# Patient Record
Sex: Female | Born: 1980 | Race: White | Hispanic: No | Marital: Married | Smoking: Former smoker
Health system: Southern US, Community
[De-identification: ages and names within clinical notes are randomized; demographics above are authoritative.]

## PROBLEM LIST (undated history)

## (undated) DIAGNOSIS — K602 Anal fissure, unspecified: Secondary | ICD-10-CM

## (undated) DIAGNOSIS — N390 Urinary tract infection, site not specified: Secondary | ICD-10-CM

## (undated) DIAGNOSIS — IMO0002 Reserved for concepts with insufficient information to code with codable children: Secondary | ICD-10-CM

## (undated) DIAGNOSIS — O009 Unspecified ectopic pregnancy without intrauterine pregnancy: Secondary | ICD-10-CM

## (undated) DIAGNOSIS — O24419 Gestational diabetes mellitus in pregnancy, unspecified control: Secondary | ICD-10-CM

## (undated) HISTORY — DX: Anal fissure, unspecified: K60.2

## (undated) HISTORY — DX: Unspecified ectopic pregnancy without intrauterine pregnancy: O00.90

## (undated) HISTORY — PX: INDUCED ABORTION: SHX677

## (undated) HISTORY — DX: Gestational diabetes mellitus in pregnancy, unspecified control: O24.419

## (undated) HISTORY — DX: Reserved for concepts with insufficient information to code with codable children: IMO0002

---

## 1999-05-18 ENCOUNTER — Emergency Department (HOSPITAL_COMMUNITY): Admission: EM | Admit: 1999-05-18 | Discharge: 1999-05-18 | Payer: Self-pay | Admitting: Emergency Medicine

## 1999-11-15 ENCOUNTER — Other Ambulatory Visit: Admission: RE | Admit: 1999-11-15 | Discharge: 1999-11-15 | Payer: Self-pay | Admitting: Obstetrics and Gynecology

## 2000-11-02 ENCOUNTER — Other Ambulatory Visit: Admission: RE | Admit: 2000-11-02 | Discharge: 2000-11-02 | Payer: Self-pay | Admitting: Obstetrics and Gynecology

## 2001-06-23 ENCOUNTER — Emergency Department (HOSPITAL_COMMUNITY): Admission: EM | Admit: 2001-06-23 | Discharge: 2001-06-23 | Payer: Self-pay | Admitting: Emergency Medicine

## 2001-06-23 ENCOUNTER — Encounter: Payer: Self-pay | Admitting: Emergency Medicine

## 2004-11-17 DIAGNOSIS — IMO0002 Reserved for concepts with insufficient information to code with codable children: Secondary | ICD-10-CM

## 2004-11-17 DIAGNOSIS — R87619 Unspecified abnormal cytological findings in specimens from cervix uteri: Secondary | ICD-10-CM

## 2004-11-17 HISTORY — DX: Reserved for concepts with insufficient information to code with codable children: IMO0002

## 2004-11-17 HISTORY — DX: Unspecified abnormal cytological findings in specimens from cervix uteri: R87.619

## 2008-04-25 ENCOUNTER — Ambulatory Visit: Payer: Self-pay | Admitting: Family Medicine

## 2008-04-25 DIAGNOSIS — J019 Acute sinusitis, unspecified: Secondary | ICD-10-CM

## 2008-04-25 DIAGNOSIS — J309 Allergic rhinitis, unspecified: Secondary | ICD-10-CM | POA: Insufficient documentation

## 2008-11-17 DIAGNOSIS — O24419 Gestational diabetes mellitus in pregnancy, unspecified control: Secondary | ICD-10-CM

## 2008-11-17 DIAGNOSIS — K602 Anal fissure, unspecified: Secondary | ICD-10-CM

## 2008-11-17 HISTORY — DX: Anal fissure, unspecified: K60.2

## 2008-11-17 HISTORY — DX: Gestational diabetes mellitus in pregnancy, unspecified control: O24.419

## 2009-02-06 ENCOUNTER — Ambulatory Visit: Admission: RE | Admit: 2009-02-06 | Discharge: 2009-02-06 | Payer: Self-pay | Admitting: Obstetrics and Gynecology

## 2009-07-24 ENCOUNTER — Encounter: Admission: RE | Admit: 2009-07-24 | Discharge: 2009-07-24 | Payer: Self-pay | Admitting: Obstetrics and Gynecology

## 2009-08-31 ENCOUNTER — Inpatient Hospital Stay (HOSPITAL_COMMUNITY): Admission: AD | Admit: 2009-08-31 | Discharge: 2009-09-01 | Payer: Self-pay | Admitting: Obstetrics

## 2009-09-11 ENCOUNTER — Encounter: Admission: RE | Admit: 2009-09-11 | Discharge: 2009-09-11 | Payer: Self-pay | Admitting: Obstetrics and Gynecology

## 2009-10-09 ENCOUNTER — Inpatient Hospital Stay (HOSPITAL_COMMUNITY): Admission: AD | Admit: 2009-10-09 | Discharge: 2009-10-11 | Payer: Self-pay | Admitting: Obstetrics and Gynecology

## 2009-10-09 ENCOUNTER — Inpatient Hospital Stay (HOSPITAL_COMMUNITY): Admission: AD | Admit: 2009-10-09 | Discharge: 2009-10-09 | Payer: Self-pay | Admitting: Obstetrics and Gynecology

## 2009-10-15 ENCOUNTER — Ambulatory Visit: Admission: RE | Admit: 2009-10-15 | Discharge: 2009-10-15 | Payer: Self-pay | Admitting: Obstetrics and Gynecology

## 2009-10-19 ENCOUNTER — Ambulatory Visit: Admission: RE | Admit: 2009-10-19 | Discharge: 2009-10-19 | Payer: Self-pay | Admitting: Obstetrics and Gynecology

## 2010-01-07 ENCOUNTER — Ambulatory Visit: Payer: Self-pay | Admitting: Family Medicine

## 2010-01-07 DIAGNOSIS — K602 Anal fissure, unspecified: Secondary | ICD-10-CM

## 2010-02-13 ENCOUNTER — Ambulatory Visit: Payer: Self-pay | Admitting: Family Medicine

## 2010-02-13 DIAGNOSIS — R300 Dysuria: Secondary | ICD-10-CM | POA: Insufficient documentation

## 2010-02-13 LAB — CONVERTED CEMR LAB
Bilirubin Urine: NEGATIVE
Glucose, Urine, Semiquant: NEGATIVE
Ketones, urine, test strip: NEGATIVE
Nitrite: NEGATIVE
Protein, U semiquant: NEGATIVE
Specific Gravity, Urine: 1.01
Urobilinogen, UA: 0.2
WBC Urine, dipstick: NEGATIVE
pH: 5.5

## 2010-02-14 ENCOUNTER — Encounter: Payer: Self-pay | Admitting: Family Medicine

## 2010-02-14 LAB — CONVERTED CEMR LAB
Clue Cells Wet Prep HPF POC: NONE SEEN
Trich, Wet Prep: NONE SEEN
Yeast Wet Prep HPF POC: NONE SEEN

## 2010-08-02 ENCOUNTER — Encounter: Payer: Self-pay | Admitting: Family Medicine

## 2010-11-14 ENCOUNTER — Ambulatory Visit
Admission: RE | Admit: 2010-11-14 | Discharge: 2010-11-14 | Payer: Self-pay | Source: Home / Self Care | Attending: Family Medicine | Admitting: Family Medicine

## 2010-11-14 DIAGNOSIS — J0100 Acute maxillary sinusitis, unspecified: Secondary | ICD-10-CM

## 2010-12-17 NOTE — Assessment & Plan Note (Signed)
Summary: dysuria   Vital Signs:  Patient profile:   30 year old female Height:      68 inches Weight:      148 pounds BMI:     22.58 O2 Sat:      98 % on Room air Temp:     98.4 degrees F oral Pulse rate:   89 / minute BP sitting:   112 / 72  (left arm) Cuff size:   regular  Vitals Entered By: Payton Spark CMA (February 13, 2010 11:08 AM)  O2 Flow:  Room air CC: C/O burning w/ urination, frequency and urgency x 3 days. Denies vaginal discharge, fever, and back pain. 4 months postpartum   Primary Care Provider:  Seymour Bars DO  CC:  C/O burning w/ urination, frequency and urgency x 3 days. Denies vaginal discharge, fever, and and back pain. 4 months postpartum.  History of Present Illness: 30 yo WF presents for dysuria, urgency and frequency that started 4 days ago.  She is having to void mutliple times at night to feel like she is completely emptying.  Her last UTI was last year.  She does not drink soda.  Denies vaginal discharge.  She has not had a period since having her baby 4 mos ago.  She is nursing.    She is not using anything for birth control.      Current Medications (verified): 1)  None  Allergies (verified): No Known Drug Allergies  Past History:  Past Medical History: Reviewed history from 01/07/2010 and no changes required. G1P1  Social History: Reviewed history from 01/07/2010 and no changes required. 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  3 mos old daughter Fransisco Hertz smoking in 2009 Denies ETOH. No regular exercise. Currently breastfeeding.  Review of Systems      See HPI  Physical Exam  General:  alert, well-developed, well-nourished, and well-hydrated.   Head:  normocephalic and atraumatic.   Eyes:  sclera non icteric Nose:  no nasal discharge.   Mouth:  pharynx pink and moist.   Neck:  no masses.   Lungs:  Normal respiratory effort, chest expands symmetrically. Lungs are clear to auscultation, no crackles  or wheezes. Heart:  Normal rate and regular rhythm. S1 and S2 normal without gallop, murmur, click, rub or other extra sounds. Abdomen:  soft.  no CVAT or suprapubic TTP.   Extremities:  no LE edema Skin:  color normal.     Impression & Recommendations:  Problem # 1:  DYSURIA (ICD-788.1) UA + for blood with ? menstrual bleeding.  Send urine for cx and wet prep obtained.  F/U results in the next 48 hrs and treat accordingly. Can use OTC AZO x 48 hrs and topical vagistat cream for comfort.  If both are normal, this could be chemical urethritis. Orders: UA Dipstick w/o Micro (automated)  (81003) T-Culture, Urine ([id number]) T-Wet Prep for Christoper Allegra, [facility] [phone])  Patient Instructions: 1)  Drink plenty of water. 2)  Use otc AZO for the next 2 days. 3)  Use OTC Vagistat cream for external comfort. 4)  Will call you with wet prep results tomorrow and urine culture results on Friday. 5)  Treat as indicated.  Laboratory Results   Urine Tests    Routine Urinalysis   Color: yellow Appearance: Clear Glucose: negative   (Normal Range: Negative) Bilirubin: negative   (Normal Range: Negative) Ketone: negative   (Normal Range: Negative) Spec. Gravity: 1.010   (Normal Range:  1.003-1.035) Blood: moderate   (Normal Range: Negative) pH: 5.5   (Normal Range: 5.0-8.0) Protein: negative   (Normal Range: Negative) Urobilinogen: 0.2   (Normal Range: 0-1) Nitrite: negative   (Normal Range: Negative) Leukocyte Esterace: negative   (Normal Range: Negative)

## 2010-12-17 NOTE — Letter (Signed)
Summary: Letter with Sigmoidoscopy Results/[facility]  Letter with Sigmoidoscopy Results/[facility]   Imported By: Lanelle Bal 08/13/2010 11:02:57  _____________________________________________________________________  External Attachment:    Type:   Image     Comment:   External Document

## 2010-12-17 NOTE — Assessment & Plan Note (Signed)
Summary: NOV anal fissure   Vital Signs:  Patient profile:   30 year old female Height:      68 inches Weight:      154 pounds BMI:     23.50 O2 Sat:      100 % on Room air Temp:     98.8 degrees F oral Pulse rate:   98 / minute BP sitting:   111 / 73  (left arm) Cuff size:   regular  Vitals Entered By: Payton Spark CMA (January 07, 2010 2:28 PM)  O2 Flow:  Room air CC: New to est. Noticing blood in toilet after bm's. Also c/o congestion. Is taking cephalexina and ketoconazole for thrush.    Primary Care Provider:  Seymour Bars DO  CC:  New to est. Noticing blood in toilet after bm's. Also c/o congestion. Is taking cephalexina and ketoconazole for thrush. .  History of Present Illness: 30 yo WF presents for blood in the stool that started a wk ago.  She has bright red blood on the toilet paper and in the toilet.  Denies hemorrhoids even though she is 3 mos post partum.  She has sharp pain with defecation.  She is not using anything.  No fam hx of colon cancer.  She has not had a period since her baby was born.  She had a level 2 tear with delivery but did not have any problems after.  She is on abx for mastitis per her OB.    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  3 mos old daughter Fransisco Hertz smoking in 2009 Denies ETOH. No regular exercise. Currently breastfeeding.  Review of Systems       no fevers/sweats/weakness, unexplained wt loss/gain, no change in vision, no difficulty hearing, ringing in ears, no hay fever/allergies, no CP/discomfort, no palpitations, no breast lump/nipple discharge, no cough/wheeze, no blood in stool, no N/V/D, no nocturia, no leaking urine, no unusual vag bleeding, no  vaginal/penile discharge, no muscle/joint pain, no rash, no new/changing mole, no HA, no memory loss, +anxiety, no sleep problem, no depression, no unexplained lumps, no easy bruising/bleeding, no concern with sexual function   Physical Exam  General:  alert, well-developed, well-nourished, and well-hydrated.   Head:  normocephalic and atraumatic.   Eyes:  conjunctiva clear Nose:  no nasal discharge.   Mouth:  good dentition and pharynx pink and moist.   Neck:  no masses.   Lungs:  Normal respiratory effort, chest expands symmetrically. Lungs are clear to auscultation, no crackles or wheezes. Heart:  Normal rate and regular rhythm. S1 and S2 normal without gallop, murmur, click, rub or other extra sounds. Rectal:  no hemorrhoids, normal sphincter tone, no masses, and anal fissure.  tender Skin:  color normal and no suspicious lesions.   Cervical Nodes:  No lymphadenopathy noted   Impression & Recommendations:  Problem # 1:  ANAL FISSURE (ICD-565.0) Treat with stool softeners, high fiber diet, plenty of water and topical Anamantle HC which has a topical lidocaine in it for pain. Call if bleeding/ pain has not improved in 2 wks.  Complete Medication List: 1)  Anamantle Hc 3-0.5 % Crea (Lidocaine-hydrocortisone ace) .... Apply to anus two times a day x 10 days  Patient Instructions:  1)  Start COLACE 1 x a day (OTC stool softener). 2)  Eat high fiber diet and drink lots of water to keep stools soft. 3)  Use moist wipes with each BM. 4)  Use RX cream 2 x a day for pain and irritation from anal fissure. 5)  Call if bleeding is not improved in 2 wks. Prescriptions: ANAMANTLE HC 3-0.5 % CREA (LIDOCAINE-HYDROCORTISONE ACE) apply to anus two times a day x 10 days  #1 tube x 0   Entered and Authorized by:   Seymour Bars DO   Signed by:   Seymour Bars DO on 01/07/2010   Method used:   Electronically to        [facility] [phone]* (retail)       [address]       [place],  [place]  [address]       Ph: [phone] or [phone]       Fax: [phone]   RxID:   [id number]

## 2010-12-19 NOTE — Assessment & Plan Note (Signed)
Summary: sinusitis   Vital Signs:  Patient profile:   30 year old female Height:      68 inches Weight:      130 pounds BMI:     19.84 O2 Sat:      98 % on Room air Temp:     97.9 degrees F oral Pulse rate:   99 / minute BP sitting:   110 / 73  (left arm) Cuff size:   regular  Vitals Entered By: Payton Spark CMA (November 14, 2010 9:13 AM)  O2 Flow:  Room air CC: ? Sinus infection x 1+ weeks. C/o drainage, congestion, and cough. OTC meds not helping.    Primary Care Provider:  Seymour Bars DO  CC:  ? Sinus infection x 1+ weeks. C/o drainage, congestion, and and cough. OTC meds not helping. Marland Kitchen  History of Present Illness: 30 yo WF presents for 10 days of HA, congestion, runny nose, nausea and malaise.  She thought it was a cold but she feels like she is getting worse.  She is taking OTC cold and cough medication.  OTC sudafed helps only a little bit.  She is taking Tylenol for her HA but it is not helping.  She has a lot of pressure behind her L cheek.  She had a low grade fever over the weekend.  She has a dry cough.  No chest tightness or SOB.    Current Medications (verified): 1)  None  Allergies (verified): No Known Drug Allergies  Past History:  Past Medical History: Reviewed history from 01/07/2010 and no changes required. G1P1  Past Surgical History: Reviewed history from 01/07/2010 and no changes required. none  Social History: Reviewed history from 01/07/2010 and no changes required. 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  32 yo daughter, Fransisco Hertz smoking in 2009 Denies ETOH. No regular exercise. .  Review of Systems      See HPI  Physical Exam  General:  alert, well-developed, well-nourished, and well-hydrated.   Head:  normocephalic and atraumatic.  bilat maxillary sinus TTP Eyes:  conjunctiva clear Nose:  no nasal discharge.   Mouth:  Oral mucosa and oropharynx without lesions or exudates.  Teeth in good  repair. Neck:  no masses.   Lungs:  Normal respiratory effort, chest expands symmetrically. Lungs are clear to auscultation, no crackles or wheezes. Heart:  Normal rate and regular rhythm. S1 and S2 normal without gallop, murmur, click, rub or other extra sounds. Skin:  color normal.   Cervical Nodes:  No lymphadenopathy noted   Impression & Recommendations:  Problem # 1:  ACUTE MAXILLARY SINUSITIS (ICD-461.0) Will treat with Amoxcillin x 10 days + Advil Cold and Sinus with Nyquil at night.   Call if not improved after 10 days. Her updated medication list for this problem includes:    Amoxicillin 500 Mg Caps (Amoxicillin) .Marland Kitchen... 1 capsule by mouth three times a day x 10 days  Complete Medication List: 1)  Amoxicillin 500 Mg Caps (Amoxicillin) .Marland Kitchen.. 1 capsule by mouth three times a day x 10 days  Patient Instructions: 1)  Take 10 days of Amoxicillin for sinusitis. 2)  Use OTC Advil Cold and Sinus for daytime symptom relief and Nyquil at night as needed. 3)  Rest, clear fluids and expect improvements over the next wk. 4)  Call if any problems. Prescriptions: AMOXICILLIN 500 MG CAPS (AMOXICILLIN) 1 capsule by mouth three times a day x 10 days  #30 x 0  Entered and Authorized by:   Seymour Bars DO   Signed by:   Seymour Bars DO on 11/14/2010   Method used:   [facility] to        [facility] [phone]* (retail)       [address]       [place], [place]  [address]       Ph: [phone] or [phone]       Fax: [phone]   RxID:   [id number]    Orders Added: 1)  Est. Patient Level III [56433]

## 2011-02-19 LAB — CBC
HCT: 36.9 % (ref 36.0–46.0)
HCT: 41.3 % (ref 36.0–46.0)
Hemoglobin: 12.7 g/dL (ref 12.0–15.0)
Hemoglobin: 14.1 g/dL (ref 12.0–15.0)
MCHC: 34.2 g/dL (ref 30.0–36.0)
MCHC: 34.4 g/dL (ref 30.0–36.0)
MCV: 98.4 fL (ref 78.0–100.0)
MCV: 99.8 fL (ref 78.0–100.0)
Platelets: 179 10*3/uL (ref 150–400)
RBC: 3.69 MIL/uL — ABNORMAL LOW (ref 3.87–5.11)
RBC: 4.2 MIL/uL (ref 3.87–5.11)
RDW: 12.9 % (ref 11.5–15.5)
WBC: 11.6 10*3/uL — ABNORMAL HIGH (ref 4.0–10.5)
WBC: 11.8 10*3/uL — ABNORMAL HIGH (ref 4.0–10.5)

## 2011-02-19 LAB — RPR: RPR Ser Ql: NONREACTIVE

## 2011-02-20 LAB — URINALYSIS, ROUTINE W REFLEX MICROSCOPIC
Bilirubin Urine: NEGATIVE
Glucose, UA: NEGATIVE mg/dL
Ketones, ur: NEGATIVE mg/dL
Protein, ur: NEGATIVE mg/dL
pH: 6.5 (ref 5.0–8.0)

## 2011-02-20 LAB — URINE MICROSCOPIC-ADD ON: RBC / HPF: NONE SEEN RBC/hpf (ref <3)

## 2011-02-27 LAB — ABO/RH: ABO/RH(D): A POS

## 2011-05-28 IMAGING — US US RENAL
1 series · 14 of 25 positions shown · non-contrast
Comparison: None

CLINICAL DATA: Right flank pain.  Hematuria.  35 weeks pregnant.

RENAL/URINARY TRACT ULTRASOUND COMPLETE

[Series 1: us renal · 0.30mm/px · 14 of 36 slices shown]
[im 1/36]
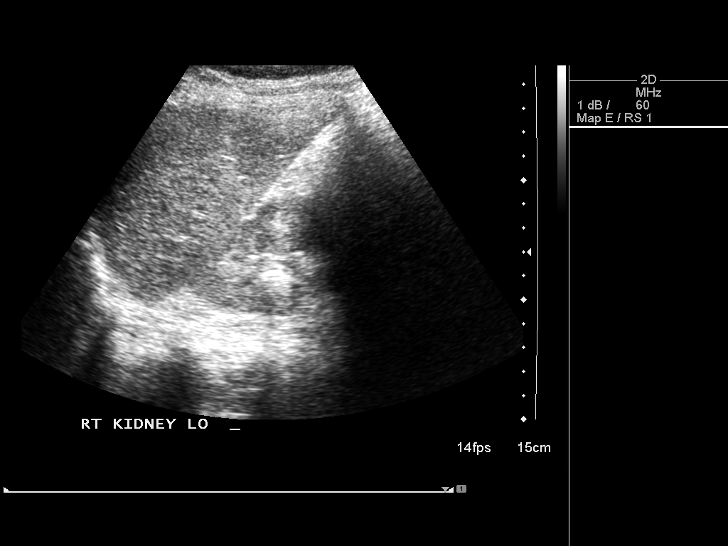
[im 3/36]
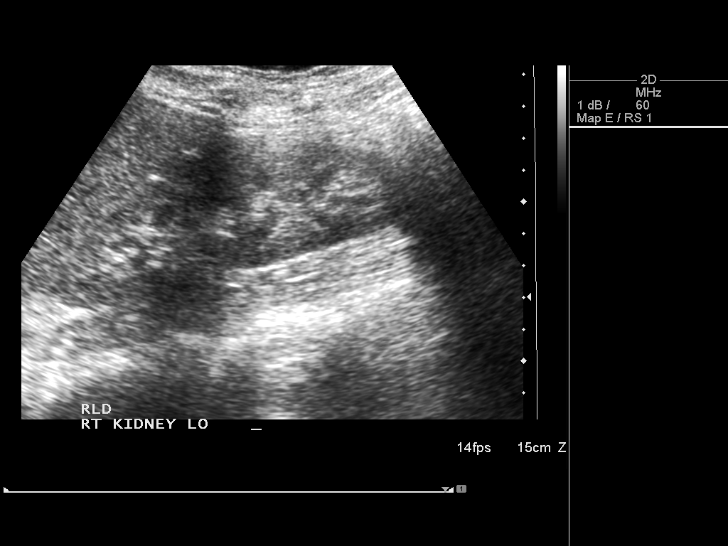
[im 6/36]
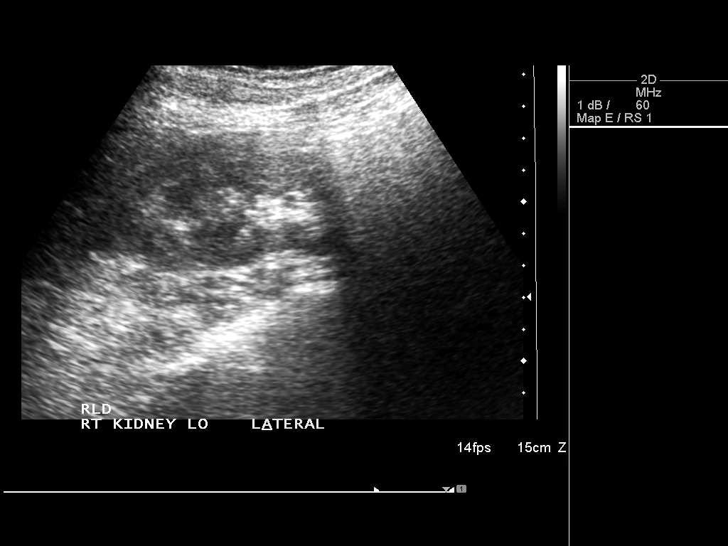
[im 9/36]
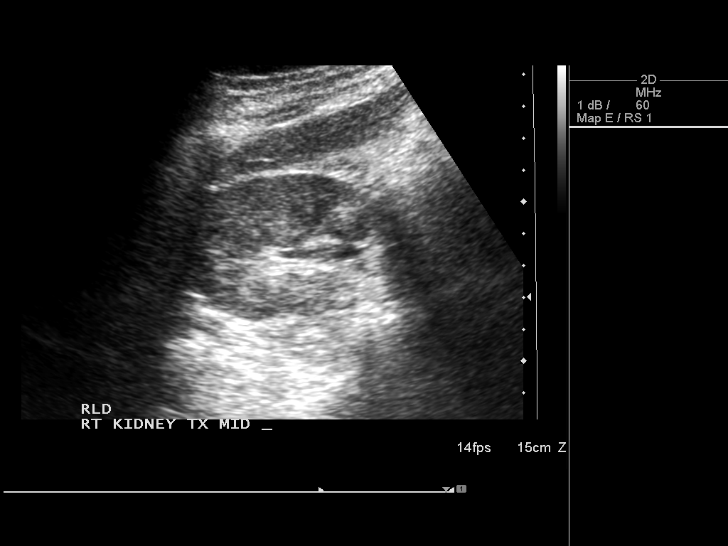
[im 12/36]
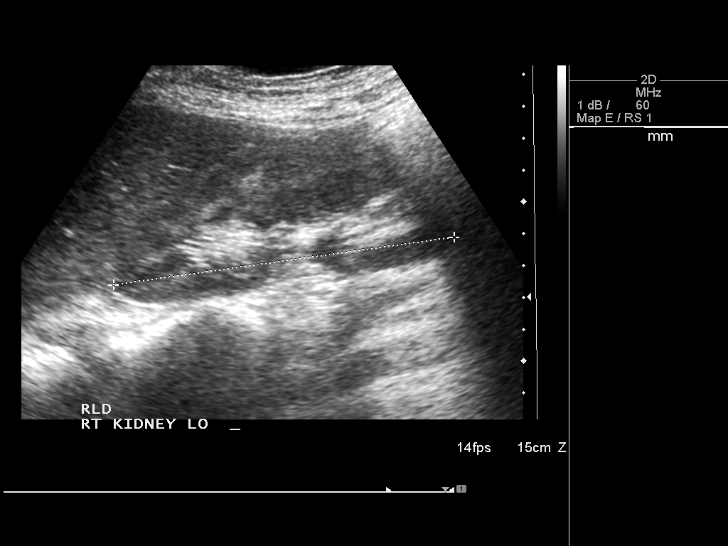
[im 14/36]
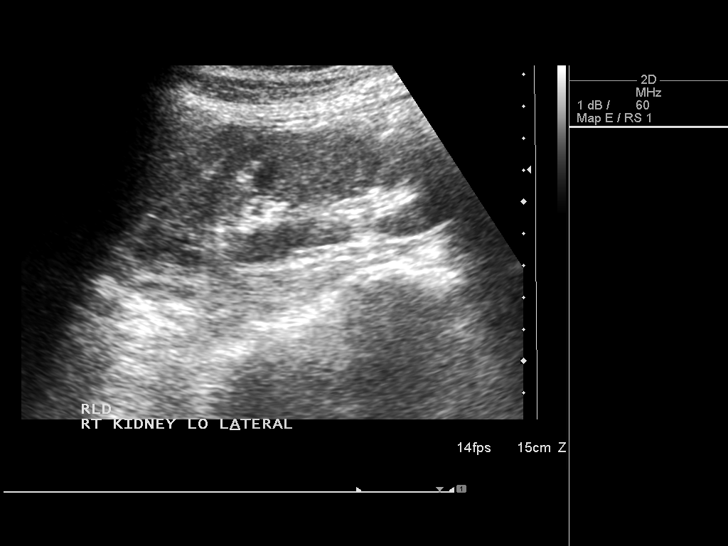
[im 17/36]
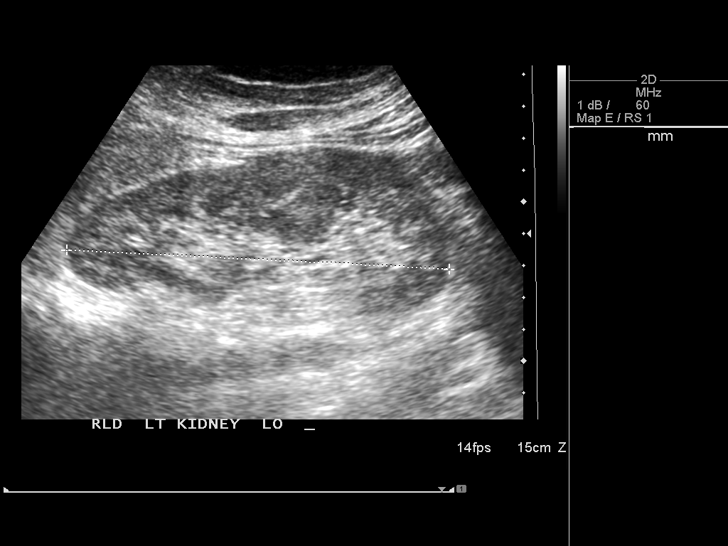
[im 19/36]
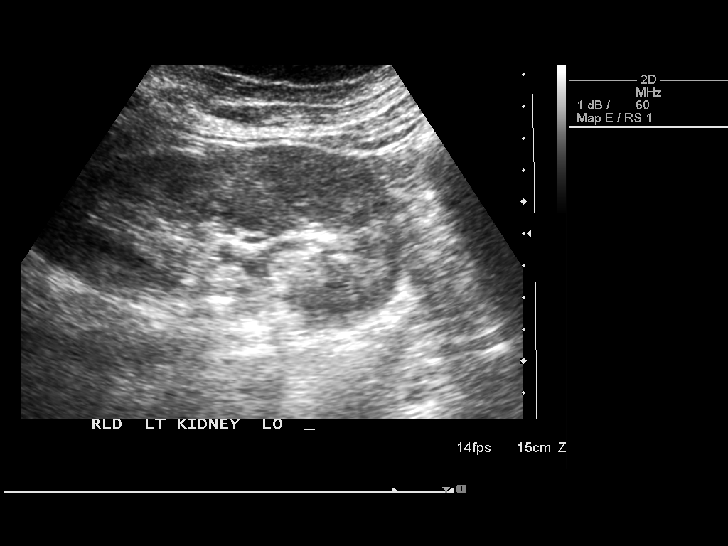
[im 22/36]
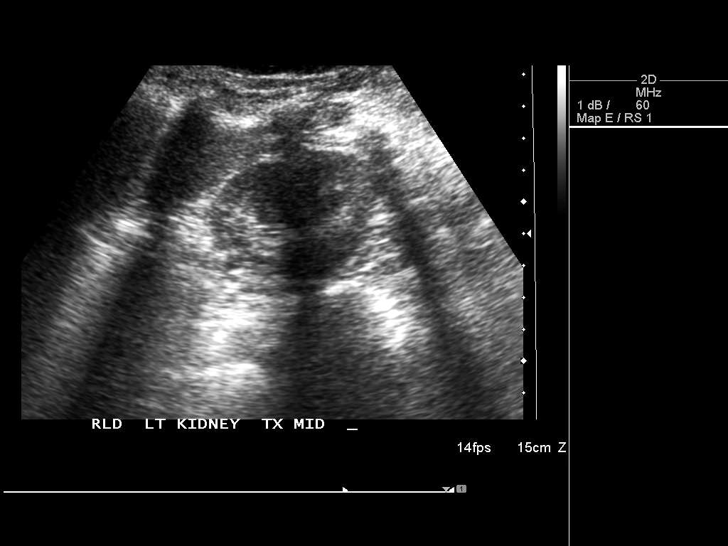
[im 24/36]
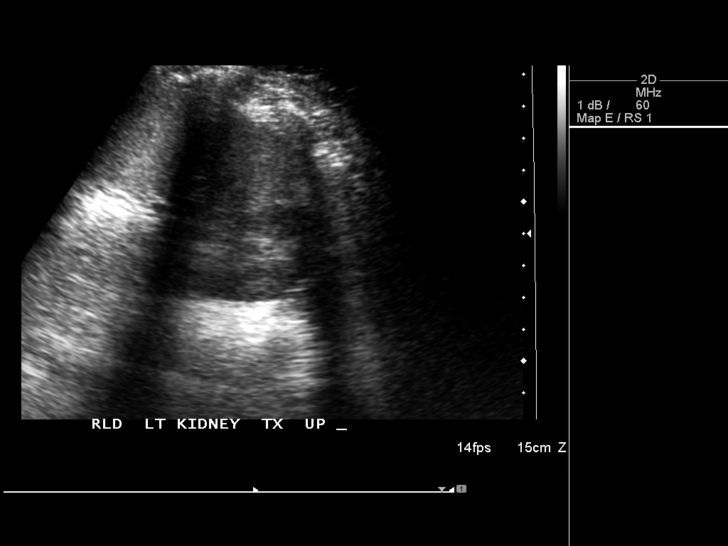
[im 27/36]
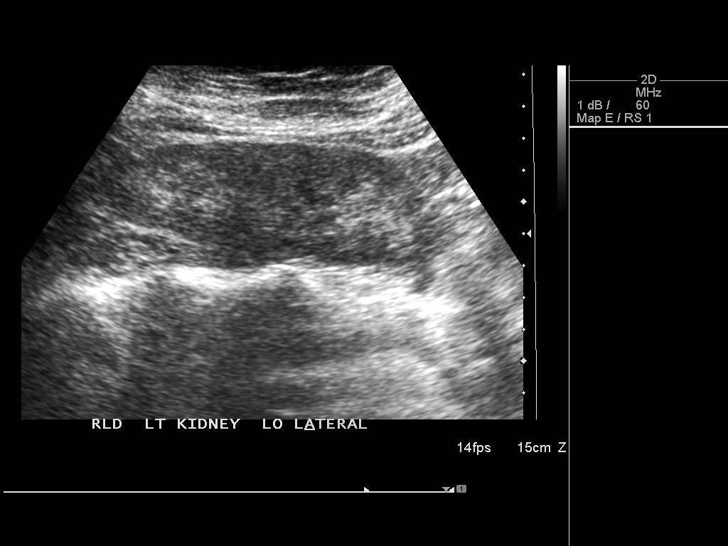
[im 30/36]
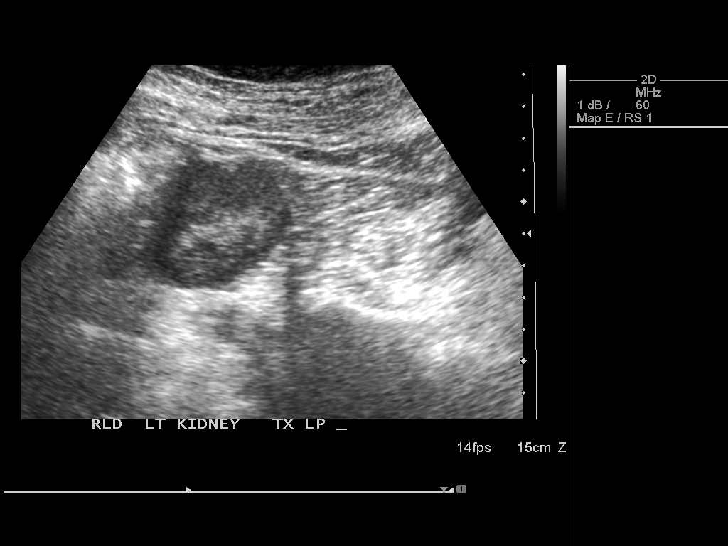
[im 33/36]
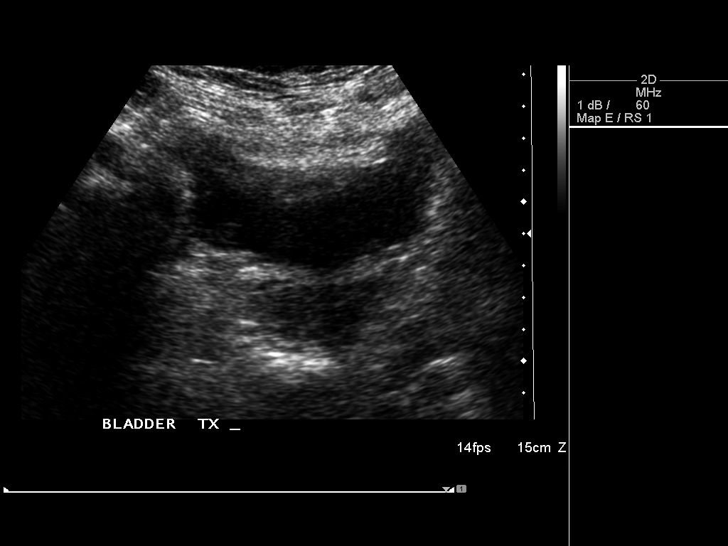
[im 36/36]
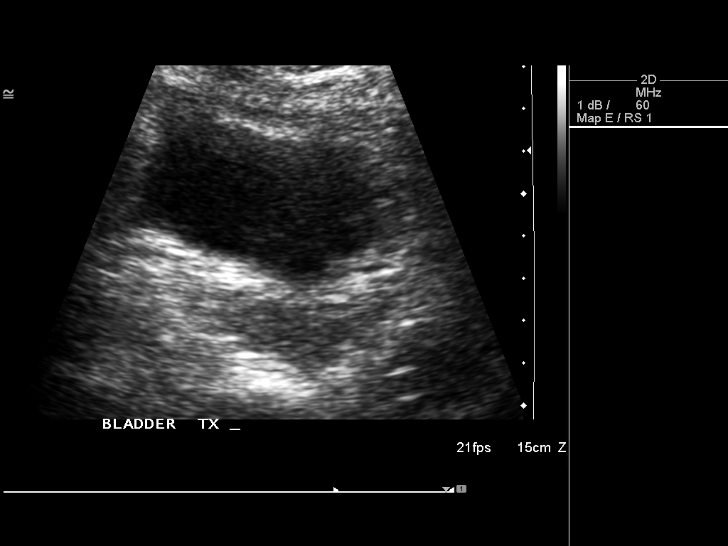

[14 of 25 positions shown; findings below may reference images not displayed]

FINDINGS: Right Kidney:  Normal in size and parenchymal echogenicity.  No
evidence of mass or hydronephrosis.

Left Kidney:  Normal in size and parenchymal echogenicity.  No
evidence of mass or hydronephrosis.

Bladder:  Appears normal for degree of bladder distention.
Intrauterine fetus noted in cephalic presentation.
IMPRESSION: Normal study.  No evidence of renal mass, hydronephrosis, or other
significant abnormality.

## 2011-06-05 ENCOUNTER — Ambulatory Visit (INDEPENDENT_AMBULATORY_CARE_PROVIDER_SITE_OTHER): Payer: BC Managed Care – PPO | Admitting: Obstetrics & Gynecology

## 2011-06-05 VITALS — BP 103/66 | Wt 128.0 lb

## 2011-06-05 DIAGNOSIS — Z3480 Encounter for supervision of other normal pregnancy, unspecified trimester: Secondary | ICD-10-CM

## 2011-06-05 NOTE — Progress Notes (Signed)
NOB intake.  Prenatal labs drawn and CF screening done.  Bedside U/S showed IUP with FHT 130bpm.  Ga [redacted]wks1day which is compatable with her LMP of 04/15/11.  Pt is currently taking her prenatal vitamins.  She is schedule for her PE with midwife next week.  Pt has had very light spotting on occasion but denies any cramping and did this with her last pregnancy.  Genetic screening reviewed with pt.

## 2011-06-06 LAB — OBSTETRIC PANEL
Antibody Screen: NEGATIVE
Basophils Absolute: 0 10*3/uL (ref 0.0–0.1)
Basophils Relative: 1 % (ref 0–1)
Eosinophils Relative: 2 % (ref 0–5)
HCT: 40.7 % (ref 36.0–46.0)
MCHC: 30.7 g/dL (ref 30.0–36.0)
Monocytes Absolute: 0.4 10*3/uL (ref 0.1–1.0)
Neutro Abs: 3.8 10*3/uL (ref 1.7–7.7)
Platelets: 177 10*3/uL (ref 150–400)
RDW: 13.5 % (ref 11.5–15.5)

## 2011-06-06 LAB — HIV ANTIBODY (ROUTINE TESTING W REFLEX): HIV: NONREACTIVE

## 2011-06-07 LAB — URINE CULTURE

## 2011-06-12 ENCOUNTER — Encounter: Payer: Self-pay | Admitting: *Deleted

## 2011-06-13 ENCOUNTER — Inpatient Hospital Stay (HOSPITAL_COMMUNITY): Admission: RE | Admit: 2011-06-13 | Payer: Self-pay | Source: Ambulatory Visit

## 2011-06-13 ENCOUNTER — Ambulatory Visit (INDEPENDENT_AMBULATORY_CARE_PROVIDER_SITE_OTHER): Payer: BC Managed Care – PPO | Admitting: Advanced Practice Midwife

## 2011-06-13 VITALS — BP 97/54 | Wt 129.0 lb

## 2011-06-13 DIAGNOSIS — Z8632 Personal history of gestational diabetes: Secondary | ICD-10-CM | POA: Insufficient documentation

## 2011-06-13 DIAGNOSIS — O09299 Supervision of pregnancy with other poor reproductive or obstetric history, unspecified trimester: Secondary | ICD-10-CM

## 2011-06-13 DIAGNOSIS — Z3480 Encounter for supervision of other normal pregnancy, unspecified trimester: Secondary | ICD-10-CM

## 2011-06-13 DIAGNOSIS — Z113 Encounter for screening for infections with a predominantly sexual mode of transmission: Secondary | ICD-10-CM

## 2011-06-13 DIAGNOSIS — Z1272 Encounter for screening for malignant neoplasm of vagina: Secondary | ICD-10-CM

## 2011-06-13 NOTE — Progress Notes (Signed)
Increased nausea and insomnia

## 2011-06-13 NOTE — Progress Notes (Signed)
  Subjective:    Sherry Page is a 30 y.o. G3P1011 [redacted]w[redacted]d being seen today for her obstetrical visit.  Patient reports nausea, no bleeding, no cramping and no leaking. Fetal movement: n/a.  Objective:    BP 97/54  Wt 129 lb (58.514 kg)  LMP 04/15/2011  Breastfeeding? Unknown  Physical Exam  Exam  Thyroid: normal Chest Clear to Auscultation HR Reg Rate Rhythm Breasts soft, no masses Abd soft and nontender, no masses Pelvic:  EGBUS WNL Uterus 8 wk size Bedside U/S IUP with good FHR Pap done Cervix long and closed Adnexae clear,nontender   FHT:  170 BPM  Uterine Size: size equals dates  Presentation: unsure     Assessment:    Pregnancy:  G3P1011    Plan:    Patient Active Problem List  Diagnoses  . ANAL FISSURE  . DYSURIA  . ACUTE SINUSITIS, UNSPECIFIED  . ALLERGIC RHINITIS  . ACUTE MAXILLARY SINUSITIS  . Previous gestational diabetes mellitus, antepartum    Follow up in 4 weeks .

## 2011-06-13 NOTE — Patient Instructions (Addendum)
Morning Sickness Morning sickness is when you feel sick to your stomach (nauseous) during pregnancy. This nauseous feeling may or may not come with throwing up (vomiting). It often occurs in the morning, but can be a problem any time of day. While morning sickness is unpleasant, it is usually harmless unless you develop severe and continual vomiting (hyperemesis gravidarum). This condition requires more intense treatment. CAUSES The cause of morning sickness is not completely known but seems to be related to a sudden increase of two hormones:   Human chorionic gonadotropin (hCG).   Estrogen hormone.  These are elevated in the first part of the pregnancy. TREATMENT Do not use any medicines (prescription, over-the-counter, or herbal) for morning sickness without first talking to your caregiver. Some patients are helped by the following:  Vitamin B6 (25mg  every 8 hours) or vitamin B6 shots.   An antihistamine called doxylamine (10mg  every 8 hours).   The herbal medication ginger.  HOME CARE INSTRUCTIONS  Taking multivitamins before getting pregnant can prevent or decrease the severity of morning sickness in most women.  Eat a piece of dry toast or unsalted crackers before getting out of bed in the morning.   Eat 5 or 6 small meals a day.   Eat dry and bland foods (rice, baked potato).   Do not drink liquids with your meals. Drink liquids between meals.   Avoid greasy, fatty, and spicy foods.   Get someone to cook for you if the smell of any food causes nausea and vomiting.   Avoid vitamin pills with iron because iron can cause nausea.  Snack on protein foods between meals if you are hungry.   Eat unsweetened gelatins for deserts.   Wear an acupressure wristband (worn for sea sickness) may be helpful.   Acupuncture may be helpful.   Do not smoke.   Get a humidifier to keep the air in your house free of odors.   SEEK MEDICAL CARE IF:  Your home remedies are not working and  you need medication.   You feel dizzy or lightheaded.   You are losing weight.   You need help with your diet.   You have an oral temperature above 100.5.  SEEK IMMEDIATE MEDICAL CARE IF:  You have persistent and uncontrolled nausea and vomiting.   You pass out (faint).   You have an oral temperature above 100.5, not controlled by medicine.  MAKE SURE YOU:   Understand these instructions.   Will watch your condition.   Will get help right away if you are not doing well or get worse.  Document Released: 12/25/2006 Document Re-Released: 04/23/2010 [facility] Patient Information 2011 [place], [place]. Pregnancy - First Trimester During sexual intercourse, millions of sperm go into the vagina. Only 1 sperm will penetrate and fertilize the female egg while it is in the Fallopian tube. One week later, the fertilized egg implants into the wall of the uterus. An embryo begins to develop into a baby. At 6 to 8 weeks, the eyes and face are formed and the heartbeat can be seen on ultrasound. At the end of 12 weeks (first trimester), all the baby's organs are formed. Now that you are pregnant, you will want to do everything you can to have a healthy baby. Two of the most important things are to get good prenatal care and follow your caregiver's instructions. Prenatal care is all the medical care you receive before the baby's birth. It is given to prevent, find and treat problems during the  pregnancy and childbirth. PRENATAL EXAMS:  During prenatal visits, your weight, blood pressure and urine are checked. This is done to make sure you are healthy and progressing normally during the pregnancy.   A pregnant woman should gain 25 to 35 pounds during the pregnancy. However, if you are over weight or underweight, your caregiver will advise you regarding your weight.   Your caregiver will ask and answer questions for you.   Blood work, cervical cultures, other necessary tests and a Pap test are done  during your prenatal exams. These tests are done to check on your health and the probable health of your baby. Tests are strongly recommended and done for HIV with your permission. This is the virus that causes AIDS. These tests are done because medications can be given to help prevent your baby from being born with this infection should you have been infected without knowing it. Blood work is also used to find out your blood type, previous infections and follow your blood levels (hemoglobin).   Low hemoglobin (anemia) is common during pregnancy. Iron and vitamins are given to help prevent this. Later in the pregnancy, blood tests for diabetes will be done along with any other tests if any problems develop. You may need tests to make sure you and the baby are doing well.   You may need other tests to make sure you and the baby are doing well.  CHANGES DURING THE FIRST TRIMESTER (THE FIRST 3 MONTHS OF PREGNANCY) Your body goes through many changes during pregnancy. They vary from person to person. Talk to your caregiver about changes you notice and are concerned about. Changes can include:  Your menstrual period stops.   The egg and sperm carry the genes that determine what you look like. Genes from you and your partner are forming a baby. The female genes determine whether the baby is a boy or a girl.   Your body increases in girth and you may feel bloated.   Feeling sick to your stomach (nauseous) and throwing up (vomiting). If the vomiting is uncontrollable, call your caregiver.   Your breasts will begin to enlarge and become tender.   Your nipples may stick out more and become darker.   The need to urinate more. Painful urination may mean you have a bladder infection.   Tiring easily.   Loss of appetite.   Cravings for certain kinds of food.   At first, you may gain or lose a couple of pounds.   You may have changes in your emotions from day to day (excited to be pregnant or concerned  something may go wrong with the pregnancy and baby).   You may have more vivid and strange dreams.  HOME CARE INSTRUCTIONS  It is very important to avoid all smoking, alcohol and un-prescribed drugs during your pregnancy. These affect the formation and growth of the baby. Avoid chemicals while pregnant to ensure the delivery of a healthy infant.   Start your prenatal visits by the 12th week of pregnancy. They are usually scheduled monthly at first, then more often in the last 2 months before delivery. Keep your caregiver's appointments. Follow your caregiver's instructions regarding medication use, blood and lab tests, exercise, and diet.   During pregnancy, you are providing food for you and your baby. Eat regular, well-balanced meals. Choose foods such as meat, fish, milk and other low fat dairy products, vegetables, fruits, and whole-grain breads and cereals. Your caregiver will tell you of the ideal weight  gain.   You can help morning sickness by keeping soda crackers (saltines) at the bedside. Eat a couple before arising in the morning. You may want to use the crackers without salt on them.   Eating 4 to 5 small meals rather than 3 large meals a day also may help the nausea and vomiting.   Drinking liquids between meals instead of during meals also seems to help nausea and vomiting.   A physical sexual relationship may be continued throughout pregnancy if there are no other problems. Problems may be early (premature) leaking of amniotic fluid from the membranes, vaginal bleeding, or belly (abdominal) pain.   Exercise regularly if there are no restrictions. Check with your caregiver or physical therapist if you are unsure of the safety of some of your exercises. Greater weight gain will occur in the last 2 trimesters of pregnancy. Exercising will help:   Control your weight.   Keep you in shape.   Prepare you for labor and delivery.   Help you lose your pregnancy weight after you  deliver your baby.   Wear a good support or jogging bra for breast tenderness during pregnancy. This may help if worn during sleep too.   Ask when prenatal classes are available. Begin classes when they are offered.   Do not use hot tubs, steam rooms or saunas.   Wear your seat belt when driving. This protects you and your baby if you are in an accident.   Avoid raw meat, uncooked cheese, cat litter boxes and soil used by cats throughout the pregnancy. These carry germs that can cause birth defects in the baby.   The first trimester is a good time to visit your dentist for your dental health. Getting your teeth cleaned is OK. Use a softer toothbrush and brush gently during pregnancy.   Ask for help if you have financial, counseling or nutritional needs during pregnancy. Your caregiver will be able to offer counseling for these needs as well as refer you for other special needs.   Do not take any medications or herbs unless told by your caregiver.   Inform your caregiver if there is any mental or physical domestic violence.   Make a list of emergency phone numbers of family, friends, hospital, police and fire department.   Write down your questions. Take them to your prenatal visit.   Do not douche.   Do not cross your legs.   If you have to stand for long periods of time, rotate you feet or take small steps in a circle.   You may have more vaginal secretions that may require a sanitary pad. Do not use tampons or scented sanitary pads.  MEDICATIONS AND DRUG USE IN PREGNANCY  Take prenatal vitamins as directed. The vitamin should contain 1 milligram of folic acid. Keep all vitamins out of reach of children. Only a couple vitamins or tablets containing iron may be fatal to a baby or young child when ingested.   Avoid use of all medications, including herbs, over-the-counter medications, not prescribed or suggested by your caregiver. Only take over-the-counter or prescription medicines  for pain, discomfort, or fever as directed by your caregiver. Do not use aspirin, ibuprofen (Motrin, Advil, Nuprin) or naproxen (Aleve) unless OK'd by your caregiver.   Let your caregiver also know about herbs you may be using.   Alcohol is related to a number of birth defects. This includes fetal alcohol syndrome. All alcohol, in any form, should be avoided completely. Smoking  will cause low birth rate and premature babies.   Street/illegal drugs are very harmful to the baby. They are absolutely forbidden. A baby born to an addicted mother will be addicted at birth. The baby will go through the same withdrawal an adult does.   Let your caregiver know about any medications that you have to take and for what reason you take them.  MISCARRIAGE IS COMMON DURING PREGNANCY A miscarriage does not mean you did something wrong. It is not a reason to worry about getting pregnant again. Your caregiver will help you with questions you may have. If you have a miscarriage, you may need minor surgery (a D & C). SEEK MEDICAL CARE IF:  You have any concerns or worries during your pregnancy. It is better to call with your questions if you feel they cannot wait, rather than worry about them.  SEEK IMMEDIATE MEDICAL CARE IF:  An unexplained oral temperature above 100.5 develops, or as your caregiver suggests.   You have leaking of fluid from the vagina (birth canal). If leaking membranes are suspected, take your temperature and inform your caregiver of this when you call.   There is vaginal spotting or bleeding. Notify your caregiver of the amount and how many pads are used.   You develop a bad smelling vaginal discharge with a change in the color.   You continue to feel sick to your stomach (nauseated) and have no relief from remedies suggested. You vomit blood or coffee ground like materials.   You lose more than 2 pounds of weight in one week.   You gain more than 2 pounds of weight in a week and you  notice swelling of your face, hands, feet or legs.   You gain 5 pounds or more in 1 week (even if you do not have swelling of your hands, face, legs or feet).   You get exposed to [place] measles and have never had them.   You are exposed to fifth disease or chicken pox.   You develop belly (abdominal) pain. Round ligament discomfort is a common non-cancerous (benign) cause of abdominal pain in pregnancy. Your caregiver still must evaluate this.   You develop headache, fever, diarrhea, pain with urination, or shortness of breath.   You fall, are in a car accident or have any kind of trauma.   There is mental or physical violence in your home.  Document Released: 10/28/2001 Document Re-Released: 04/23/2010 [facility] Patient Information 2011 [address], [place].

## 2011-07-11 ENCOUNTER — Ambulatory Visit (INDEPENDENT_AMBULATORY_CARE_PROVIDER_SITE_OTHER): Payer: BC Managed Care – PPO | Admitting: Obstetrics and Gynecology

## 2011-07-11 DIAGNOSIS — Z3480 Encounter for supervision of other normal pregnancy, unspecified trimester: Secondary | ICD-10-CM

## 2011-07-11 NOTE — Progress Notes (Signed)
[id number] 

## 2011-07-18 ENCOUNTER — Other Ambulatory Visit: Payer: BC Managed Care – PPO

## 2011-07-18 DIAGNOSIS — O99810 Abnormal glucose complicating pregnancy: Secondary | ICD-10-CM

## 2011-07-23 ENCOUNTER — Telehealth: Payer: Self-pay

## 2011-07-23 NOTE — Progress Notes (Signed)
[id number] at [redacted]w[redacted]d for routine PNV. Hx x A1 GDM in 2010 and is following CHO restricted diet. Nausea persists but without vomiting. Denies bleeding. Declines any genetic screens.

## 2011-07-23 NOTE — Telephone Encounter (Signed)
Spoke with pt.  Advised her per Dr Penne Lash, okay to continue fiber supplement.  She needs to increase daily fluid intake, minimum of 8 full glasses of water or non caffeinated drinks daily.  Also advised her to take colace daily for the next week until she felt like her bowels were more regulated.  Offered assurance for taking fiber and colace.  Pt to call with any additional questions

## 2011-07-23 NOTE — Telephone Encounter (Signed)
Spoke with pt.  She states that she was out walking with her daughter last weekend, got hot, felt faint.  Advised she likely got too hot.  She needs to be sure she is drinking enough fluids, okay to exercise as prior to pregnancy, but has to be cautious in heat/humidity to be sure she has adequate hydration.  She also mentions she is having severe constipation.  This was a problem prior to pregnancy, but she has stopped her fiber supplements, so problem has gotten worse.  She is taking colace QOD.  She strains so much, she feels like her "vagina is going to fall out".  She had flex sig in 07/2010 for bleeding associated with constipation, was found to have fissures.  Would like to know what is safe to take and what is not.  Advised will speak to MD and call back with recommendations

## 2011-07-28 ENCOUNTER — Other Ambulatory Visit: Payer: Self-pay | Admitting: Obstetrics & Gynecology

## 2011-07-28 DIAGNOSIS — Z3689 Encounter for other specified antenatal screening: Secondary | ICD-10-CM

## 2011-08-11 ENCOUNTER — Encounter: Payer: Self-pay | Admitting: Physician Assistant

## 2011-08-11 ENCOUNTER — Ambulatory Visit (INDEPENDENT_AMBULATORY_CARE_PROVIDER_SITE_OTHER): Payer: BC Managed Care – PPO | Admitting: Physician Assistant

## 2011-08-11 DIAGNOSIS — Z3480 Encounter for supervision of other normal pregnancy, unspecified trimester: Secondary | ICD-10-CM

## 2011-08-11 NOTE — Patient Instructions (Signed)
Pregnancy - Second Trimester The second trimester of pregnancy (3 to 6 months) is a period of rapid growth for you and your baby. At the end of the sixth month, your baby is about 9 inches long and weighs 1 1/2 pounds. You will begin to feel the baby move between 18 and 20 weeks of the pregnancy. This is called quickening. Weight gain is faster. A clear fluid (colostrum) may leak out of your breasts. You may feel small contractions of the womb (uterus). This is known as false labor or Braxton-Hicks contractions. This is like a practice for labor when the baby is ready to be born. Usually, the problems with morning sickness have usually passed by the end of your first trimester. Some women develop small dark blotches (called cholasma, mask of pregnancy) on their face that usually goes away after the baby is born. Exposure to the sun makes the blotches worse. Acne may also develop in some pregnant women and pregnant women who have acne, may find that it goes away. PRENATAL EXAMS  Blood work may continue to be done during prenatal exams. These tests are done to check on your health and the probable health of your baby. Blood work is used to follow your blood levels (hemoglobin). Anemia (low hemoglobin) is common during pregnancy. Iron and vitamins are given to help prevent this. You will also be checked for diabetes between 24 and 28 weeks of the pregnancy. Some of the previous blood tests may be repeated.   The size of the uterus is measured during each visit. This is to make sure that the baby is continuing to grow properly according to the dates of the pregnancy.   Your blood pressure is checked every prenatal visit. This is to make sure you are not getting toxemia.   Your urine is checked to make sure you do not have an infection, diabetes or protein in the urine.   Your weight is checked often to make sure gains are happening at the suggested rate. This is to ensure that both you and your baby are  growing normally.   Sometimes, an ultrasound is performed to confirm the proper growth and development of the baby. This is a test which bounces harmless sound waves off the baby so your caregiver can more accurately determine due dates.  Sometimes, a specialized test is done on the amniotic fluid surrounding the baby. This test is called an amniocentesis. The amniotic fluid is obtained by sticking a needle into the belly (abdomen). This is done to check the chromosomes in instances where there is a concern about possible genetic problems with the baby. It is also sometimes done near the end of pregnancy if an early delivery is required. In this case, it is done to help make sure the baby's lungs are mature enough for the baby to live outside of the womb. CHANGES OCCURING IN THE SECOND TRIMESTER OF PREGNANCY Your body goes through many changes during pregnancy. They vary from person to person. Talk to your caregiver about changes you notice that you are concerned about.  During the second trimester, you will likely have an increase in your appetite. It is normal to have cravings for certain foods. This varies from person to person and pregnancy to pregnancy.   Your lower abdomen will begin to bulge.   You may have to urinate more often because the uterus and baby are pressing on your bladder. It is also common to get more bladder infections during pregnancy (  pain with urination). You can help this by drinking lots of fluids and emptying your bladder before and after intercourse.   You may begin to get stretch marks on your hips, abdomen, and breasts. These are normal changes in the body during pregnancy. There are no exercises or medications to take that prevent this change.   You may begin to develop swollen and bulging veins (varicose veins) in your legs. Wearing support hose, elevating your feet for 15 minutes, 3 to 4 times a day and limiting salt in your diet helps lessen the problem.    Heartburn may develop as the uterus grows and pushes up against the stomach. Antacids recommended by your caregiver helps with this problem. Also, eating smaller meals 4 to 5 times a day helps.   Constipation can be treated with a stool softener or adding bulk to your diet. Drinking lots of fluids, vegetables, fruits, and whole grains are helpful.   Exercising is also helpful. If you have been very active up until your pregnancy, most of these activities can be continued during your pregnancy. If you have been less active, it is helpful to start an exercise program such as walking.   Hemorrhoids (varicose veins in the rectum) may develop at the end of the second trimester. Warm sitz baths and hemorrhoid cream recommended by your caregiver helps hemorrhoid problems.   Backaches may develop during this time of your pregnancy. Avoid heavy lifting, wear low heal shoes and practice good posture to help with backache problems.   Some pregnant women develop tingling and numbness of their hand and fingers because of swelling and tightening of ligaments in the wrist (carpel tunnel syndrome). This goes away after the baby is born.   As your breasts enlarge, you may have to get a bigger bra. Get a comfortable, cotton, support bra. Do not get a nursing bra until the last month of the pregnancy if you will be nursing the baby.   You may get a dark line from your belly button to the pubic area called the linea nigra.   You may develop rosy cheeks because of increase blood flow to the face.   You may develop spider looking lines of the face, neck, arms and chest. These go away after the baby is born.  HOME CARE INSTRUCTIONS  It is extremely important to avoid all smoking, herbs, alcohol, and un-prescribed drugs during your pregnancy. These chemicals affect the formation and growth of the baby. Avoid these chemicals throughout the pregnancy to ensure the delivery of a healthy infant.   Most of your home  care instructions are the same as suggested for the first trimester of your pregnancy. Keep your caregiver's appointments. Follow your caregiver's instructions regarding medication use, exercise and diet.   During pregnancy, you are providing food for you and your baby. Continue to eat regular, well-balanced meals. Choose foods such as meat, fish, milk and other low fat dairy products, vegetables, fruits, and whole-grain breads and cereals. Your caregiver will tell you of the ideal weight gain.   A physical sexual relationship may be continued up until near the end of pregnancy if there are no other problems. Problems could include early (premature) leaking of amniotic fluid from the membranes, vaginal bleeding, abdominal pain, or other medical or pregnancy problems.   Exercise regularly if there are no restrictions. Check with your caregiver if you are unsure of the safety of some of your exercises. The greatest weight gain will occur in the last   2 trimesters of pregnancy. Exercise will help you:   Control your weight.   Get you in shape for labor and delivery.   Lose weight after you have the baby.   Wear a good support or jogging bra for breast tenderness during pregnancy. This may help if worn during sleep. Pads or tissues may be used in the bra if you are leaking colostrum.   Do not use hot tubs, steam rooms or saunas throughout the pregnancy.   Wear your seat belt at all times when driving. This protects you and your baby if you are in an accident.   Avoid raw meat, uncooked cheese, cat litter boxes and soil used by cats. These carry germs that can cause birth defects in the baby.   The second trimester is also a good time to visit your dentist for your dental health if this has not been done yet. Getting your teeth cleaned is OK. Use a soft toothbrush. Brush gently during pregnancy.   It is easier to loose urine during pregnancy. Tightening up and strengthening the pelvic muscles will  help with this problem. Practice stopping your urination while you are going to the bathroom. These are the same muscles you need to strengthen. It is also the muscles you would use as if you were trying to stop from passing gas. You can practice tightening these muscles up 10 times a set and repeating this about 3 times per day. Once you know what muscles to tighten up, do not perform these exercises during urination. It is more likely to contribute to an infection by backing up the urine.   Ask for help if you have financial, counseling or nutritional needs during pregnancy. Your caregiver will be able to offer counseling for these needs as well as refer you for other special needs.   Your skin may become oily. If so, wash your face with mild soap, use non-greasy moisturizer and oil or cream based makeup.  MEDICATIONS AND DRUG USE IN PREGNANCY  Take prenatal vitamins as directed. The vitamin should contain 1 milligram of folic acid. Keep all vitamins out of reach of children. Only a couple vitamins or tablets containing iron may be fatal to a baby or young child when ingested.   Avoid use of all medications, including herbs, over-the-counter medications, not prescribed or suggested by your caregiver. Only take over-the-counter or prescription medicines for pain, discomfort, or fever as directed by your caregiver. Do not use aspirin.   Let your caregiver also know about herbs you may be using.   Alcohol is related to a number of birth defects. This includes fetal alcohol syndrome. All alcohol, in any form, should be avoided completely. Smoking will cause low birth rate and premature babies.   Street/illegal drugs are very harmful to the baby. They are absolutely forbidden. A baby born to an addicted mother will be addicted at birth. The baby will go through the same withdrawal an adult does.  SEEK MEDICAL CARE IF:  You have any concerns or worries during your pregnancy. It is better to call with  your questions if you feel they cannot wait, rather than worry about them.  SEEK IMMEDIATE MEDICAL CARE IF:  An unexplained oral temperature above 100.5 develops, or as your caregiver suggests.   You have leaking of fluid from the vagina (birth canal). If leaking membranes are suspected, take your temperature and tell your caregiver of this when you call.   There is vaginal spotting, bleeding, or passing  clots. Tell your caregiver of the amount and how many pads are used. Light spotting in pregnancy is common, especially following intercourse.   You develop a bad smelling vaginal discharge with a change in the color from clear to white.   You continue to feel sick to your stomach (nauseated) and have no relief from remedies suggested. You vomit blood or coffee ground like materials.   You loose more than 2 pounds of weight or gain more than 2 pounds of weight over a weeks time, or as suggested by your caregiver.   You notice swelling of your face, hands, and feet or legs.   You get exposed to German measles and have never had them.   You are exposed to fifth disease or chicken pox.   You develop belly (abdominal) pain. Round ligament discomfort is a common non-cancerous (benign) cause of abdominal pain in pregnancy. Your caregiver still must evaluate you.   You develop a bad headache that does not go away.   You develop fever, diarrhea, pain with urination or shortness of breath.   You develop visual problems, blurry or double vision.   You fall, are in a car accident or any kind of trauma.   There is mental or physical violence at home.  Document Released: 10/28/2001 Document Re-Released: 01/28/2010 [employer] Patient Information 2011 [employer]. 

## 2011-08-11 NOTE — Progress Notes (Signed)
p-92 

## 2011-08-11 NOTE — Progress Notes (Signed)
No complaints. Declines quad screen today. Anatomy US scheduled at 18 weeks. Anticipatory guidance

## 2011-08-22 ENCOUNTER — Ambulatory Visit (HOSPITAL_COMMUNITY)
Admission: RE | Admit: 2011-08-22 | Discharge: 2011-08-22 | Disposition: A | Discharge status: Home / Self Care | Payer: BC Managed Care – PPO | Source: Ambulatory Visit | Attending: Obstetrics & Gynecology | Admitting: Obstetrics & Gynecology

## 2011-08-22 DIAGNOSIS — Z3689 Encounter for other specified antenatal screening: Secondary | ICD-10-CM

## 2011-08-22 DIAGNOSIS — Z1389 Encounter for screening for other disorder: Secondary | ICD-10-CM | POA: Insufficient documentation

## 2011-08-22 DIAGNOSIS — O358XX0 Maternal care for other (suspected) fetal abnormality and damage, not applicable or unspecified: Secondary | ICD-10-CM | POA: Insufficient documentation

## 2011-08-22 DIAGNOSIS — Z363 Encounter for antenatal screening for malformations: Secondary | ICD-10-CM | POA: Insufficient documentation

## 2011-09-12 ENCOUNTER — Ambulatory Visit (INDEPENDENT_AMBULATORY_CARE_PROVIDER_SITE_OTHER): Payer: BC Managed Care – PPO | Admitting: Advanced Practice Midwife

## 2011-09-12 VITALS — BP 94/56 | Temp 97.4°F | Wt 139.0 lb

## 2011-09-12 DIAGNOSIS — R350 Frequency of micturition: Secondary | ICD-10-CM

## 2011-09-12 DIAGNOSIS — N390 Urinary tract infection, site not specified: Secondary | ICD-10-CM

## 2011-09-12 DIAGNOSIS — Z3480 Encounter for supervision of other normal pregnancy, unspecified trimester: Secondary | ICD-10-CM

## 2011-09-12 MED ORDER — CEPHALEXIN 250 MG PO CAPS: 500.0000 mg | ORAL_CAPSULE | Freq: Four times a day (QID) | ORAL | Status: AC

## 2011-09-12 NOTE — Progress Notes (Signed)
Large amt of blood

## 2011-09-15 ENCOUNTER — Telehealth: Payer: Self-pay | Admitting: Advanced Practice Midwife

## 2011-09-15 NOTE — Progress Notes (Signed)
Hematuria noted. Pt reports frequency, no fever or flank pain, No CVAT. Will treat with Keflex and send for culture

## 2011-10-02 ENCOUNTER — Other Ambulatory Visit (INDEPENDENT_AMBULATORY_CARE_PROVIDER_SITE_OTHER): Payer: BC Managed Care – PPO | Admitting: *Deleted

## 2011-10-02 VITALS — BP 108/65 | HR 91 | Temp 96.9°F | Resp 16 | Ht 66.0 in | Wt 144.0 lb

## 2011-10-02 DIAGNOSIS — N390 Urinary tract infection, site not specified: Secondary | ICD-10-CM

## 2011-10-02 LAB — POCT URINALYSIS DIPSTICK
Glucose, UA: NEGATIVE
Nitrite, UA: POSITIVE
Urobilinogen, UA: 0.2

## 2011-10-02 MED ORDER — CEPHALEXIN 500 MG PO CAPS: 500.0000 mg | ORAL_CAPSULE | Freq: Three times a day (TID) | ORAL | Status: AC

## 2011-10-02 NOTE — Progress Notes (Signed)
Pt here @ 24 weeks and c/o's urinary frequency and burning.  She states that she got several UTI's during her last pregnancy.  Dipstick urine today showed mod blood and small leukacytes.  Spoke with Dr. Jolayne Panther via phone and she ordered Keflex 500mg  TID x 7 days.  Will send urine for C&S also.  This was escribed to [facility].

## 2011-10-02 NOTE — Progress Notes (Deleted)
Subjective:     Patient ID: Sherry Page, female   DOB: 09-Sep-1981, 30 y.o.   MRN: [id number]  HPI   Review of Systems     Objective:   Physical Exam     Assessment:     ***    Plan:     ***

## 2011-10-04 LAB — CULTURE, URINE COMPREHENSIVE
Colony Count: NO GROWTH
Organism ID, Bacteria: NO GROWTH

## 2011-10-06 ENCOUNTER — Telehealth: Payer: Self-pay | Admitting: *Deleted

## 2011-10-06 NOTE — Telephone Encounter (Signed)
Pt notified that her urine culture was completely normal.  She does state that she isn't haven't the urgency or burning when she voids now.

## 2011-10-08 ENCOUNTER — Ambulatory Visit (INDEPENDENT_AMBULATORY_CARE_PROVIDER_SITE_OTHER): Payer: BC Managed Care – PPO | Admitting: Obstetrics & Gynecology

## 2011-10-08 VITALS — BP 110/64 | Temp 98.4°F | Wt 147.0 lb

## 2011-10-08 DIAGNOSIS — Z3480 Encounter for supervision of other normal pregnancy, unspecified trimester: Secondary | ICD-10-CM

## 2011-10-08 NOTE — Progress Notes (Signed)
28 wk labs today  P-92  Mod blood

## 2011-10-08 NOTE — Progress Notes (Signed)
I have reviewed her anatomy scan and it is normal.  She is getting her 2nd 1 hour glucola today. She has no complaints.  She declines the flu shot.

## 2011-10-09 LAB — CBC
MCH: 32.4 pg (ref 26.0–34.0)
MCHC: 32.1 g/dL (ref 30.0–36.0)
Platelets: 169 10*3/uL (ref 150–400)

## 2011-10-09 LAB — HIV ANTIBODY (ROUTINE TESTING W REFLEX): HIV: NONREACTIVE

## 2011-10-09 LAB — GLUCOSE TOLERANCE, 1 HOUR: Glucose, 1 Hour GTT: 88 mg/dL (ref 70–140)

## 2011-11-12 ENCOUNTER — Encounter: Payer: BC Managed Care – PPO | Admitting: Obstetrics & Gynecology

## 2011-11-13 ENCOUNTER — Ambulatory Visit (INDEPENDENT_AMBULATORY_CARE_PROVIDER_SITE_OTHER): Payer: BC Managed Care – PPO | Admitting: Physician Assistant

## 2011-11-13 DIAGNOSIS — R319 Hematuria, unspecified: Secondary | ICD-10-CM

## 2011-11-13 DIAGNOSIS — Z3480 Encounter for supervision of other normal pregnancy, unspecified trimester: Secondary | ICD-10-CM

## 2011-11-13 NOTE — Progress Notes (Signed)
Constipations. Rec: Miralax and Colace

## 2011-11-13 NOTE — Patient Instructions (Signed)
Pregnancy - Third Trimester The third trimester of pregnancy (the last 3 months) is a period of the most rapid growth for you and your baby. The baby approaches a length of 20 inches and a weight of 6 to 10 pounds. The baby is adding on fat and getting ready for life outside your body. While inside, babies have periods of sleeping and waking, suck their thumbs, and hiccups. You can often feel small contractions of the uterus. This is false labor. It is also called Braxton-Hicks contractions. This is like a practice for labor. The usual problems in this stage of pregnancy include more difficulty breathing, swelling of the hands and feet from water retention, and having to urinate more often because of the uterus and baby pressing on your bladder.  PRENATAL EXAMS  Blood work may continue to be done during prenatal exams. These tests are done to check on your health and the probable health of your baby. Blood work is used to follow your blood levels (hemoglobin). Anemia (low hemoglobin) is common during pregnancy. Iron and vitamins are given to help prevent this. You may also continue to be checked for diabetes. Some of the past blood tests may be done again.   The size of the uterus is measured during each visit. This makes sure your baby is growing properly according to your pregnancy dates.   Your blood pressure is checked every prenatal visit. This is to make sure you are not getting toxemia.   Your urine is checked every prenatal visit for infection, diabetes and protein.   Your weight is checked at each visit. This is done to make sure gains are happening at the suggested rate and that you and your baby are growing normally.   Sometimes, an ultrasound is performed to confirm the position and the proper growth and development of the baby. This is a test done that bounces harmless sound waves off the baby so your caregiver can more accurately determine due dates.   Discuss the type of pain  medication and anesthesia you will have during your labor and delivery.   Discuss the possibility and anesthesia if a Cesarean Section might be necessary.   Inform your caregiver if there is any mental or physical violence at home.  Sometimes, a specialized non-stress test, contraction stress test and biophysical profile are done to make sure the baby is not having a problem. Checking the amniotic fluid surrounding the baby is called an amniocentesis. The amniotic fluid is removed by sticking a needle into the belly (abdomen). This is sometimes done near the end of pregnancy if an early delivery is required. In this case, it is done to help make sure the baby's lungs are mature enough for the baby to live outside of the womb. If the lungs are not mature and it is unsafe to deliver the baby, an injection of cortisone medication is given to the mother 1 to 2 days before the delivery. This helps the baby's lungs mature and makes it safer to deliver the baby. CHANGES OCCURING IN THE THIRD TRIMESTER OF PREGNANCY Your body goes through many changes during pregnancy. They vary from person to person. Talk to your caregiver about changes you notice and are concerned about.  During the last trimester, you have probably had an increase in your appetite. It is normal to have cravings for certain foods. This varies from person to person and pregnancy to pregnancy.   You may begin to get stretch marks on your hips,   abdomen, and breasts. These are normal changes in the body during pregnancy. There are no exercises or medications to take which prevent this change.   Constipation may be treated with a stool softener or adding bulk to your diet. Drinking lots of fluids, fiber in vegetables, fruits, and whole grains are helpful.   Exercising is also helpful. If you have been very active up until your pregnancy, most of these activities can be continued during your pregnancy. If you have been less active, it is helpful  to start an exercise program such as walking. Consult your caregiver before starting exercise programs.   Avoid all smoking, alcohol, un-prescribed drugs, herbs and "street drugs" during your pregnancy. These chemicals affect the formation and growth of the baby. Avoid chemicals throughout the pregnancy to ensure the delivery of a healthy infant.   Backache, varicose veins and hemorrhoids may develop or get worse.   You will tire more easily in the third trimester, which is normal.   The baby's movements may be stronger and more often.   You may become short of breath easily.   Your belly button may stick out.   A yellow discharge may leak from your breasts called colostrum.   You may have a bloody mucus discharge. This usually occurs a few days to a week before labor begins.  HOME CARE INSTRUCTIONS   Keep your caregiver's appointments. Follow your caregiver's instructions regarding medication use, exercise, and diet.   During pregnancy, you are providing food for you and your baby. Continue to eat regular, well-balanced meals. Choose foods such as meat, fish, milk and other low fat dairy products, vegetables, fruits, and whole-grain breads and cereals. Your caregiver will tell you of the ideal weight gain.   A physical sexual relationship may be continued throughout pregnancy if there are no other problems such as early (premature) leaking of amniotic fluid from the membranes, vaginal bleeding, or belly (abdominal) pain.   Exercise regularly if there are no restrictions. Check with your caregiver if you are unsure of the safety of your exercises. Greater weight gain will occur in the last 2 trimesters of pregnancy. Exercising helps:   Control your weight.   Get you in shape for labor and delivery.   You lose weight after you deliver.   Rest a lot with legs elevated, or as needed for leg cramps or low back pain.   Wear a good support or jogging bra for breast tenderness during  pregnancy. This may help if worn during sleep. Pads or tissues may be used in the bra if you are leaking colostrum.   Do not use hot tubs, steam rooms, or saunas.   Wear your seat belt when driving. This protects you and your baby if you are in an accident.   Avoid raw meat, cat litter boxes and soil used by cats. These carry germs that can cause birth defects in the baby.   It is easier to loose urine during pregnancy. Tightening up and strengthening the pelvic muscles will help with this problem. You can practice stopping your urination while you are going to the bathroom. These are the same muscles you need to strengthen. It is also the muscles you would use if you were trying to stop from passing gas. You can practice tightening these muscles up 10 times a set and repeating this about 3 times per day. Once you know what muscles to tighten up, do not perform these exercises during urination. It is more likely   to cause an infection by backing up the urine.   Ask for help if you have financial, counseling or nutritional needs during pregnancy. Your caregiver will be able to offer counseling for these needs as well as refer you for other special needs.   Make a list of emergency phone numbers and have them available.   Plan on getting help from family or friends when you go home from the hospital.   Make a trial run to the hospital.   Take prenatal classes with the father to understand, practice and ask questions about the labor and delivery.   Prepare the baby's room/nursery.   Do not travel out of the city unless it is absolutely necessary and with the advice of your caregiver.   Wear only low or no heal shoes to have better balance and prevent falling.  MEDICATIONS AND DRUG USE IN PREGNANCY  Take prenatal vitamins as directed. The vitamin should contain 1 milligram of folic acid. Keep all vitamins out of reach of children. Only a couple vitamins or tablets containing iron may be fatal  to a baby or young child when ingested.   Avoid use of all medications, including herbs, over-the-counter medications, not prescribed or suggested by your caregiver. Only take over-the-counter or prescription medicines for pain, discomfort, or fever as directed by your caregiver. Do not use aspirin, ibuprofen (Motrin, Advil, Nuprin) or naproxen (Aleve) unless OK'd by your caregiver.   Let your caregiver also know about herbs you may be using.   Alcohol is related to a number of birth defects. This includes fetal alcohol syndrome. All alcohol, in any form, should be avoided completely. Smoking will cause low birth rate and premature babies.   Street/illegal drugs are very harmful to the baby. They are absolutely forbidden. A baby born to an addicted mother will be addicted at birth. The baby will go through the same withdrawal an adult does.  SEEK MEDICAL CARE IF: You have any concerns or worries during your pregnancy. It is better to call with your questions if you feel they cannot wait, rather than worry about them. DECISIONS ABOUT CIRCUMCISION You may or may not know the sex of your baby. If you know your baby is a boy, it may be time to think about circumcision. Circumcision is the removal of the foreskin of the penis. This is the skin that covers the sensitive end of the penis. There is no proven medical need for this. Often this decision is made on what is popular at the time or based upon religious beliefs and social issues. You can discuss these issues with your caregiver or pediatrician. SEEK IMMEDIATE MEDICAL CARE IF:   An unexplained oral temperature above 102 F (38.9 C) develops, or as your caregiver suggests.   You have leaking of fluid from the vagina (birth canal). If leaking membranes are suspected, take your temperature and tell your caregiver of this when you call.   There is vaginal spotting, bleeding or passing clots. Tell your caregiver of the amount and how many pads are  used.   You develop a bad smelling vaginal discharge with a change in the color from clear to white.   You develop vomiting that lasts more than 24 hours.   You develop chills or fever.   You develop shortness of breath.   You develop burning on urination.   You loose more than 2 pounds of weight or gain more than 2 pounds of weight or as suggested by your   caregiver.   You notice sudden swelling of your face, hands, and feet or legs.   You develop belly (abdominal) pain. Round ligament discomfort is a common non-cancerous (benign) cause of abdominal pain in pregnancy. Your caregiver still must evaluate you.   You develop a severe headache that does not go away.   You develop visual problems, blurred or double vision.   If you have not felt your baby move for more than 1 hour. If you think the baby is not moving as much as usual, eat something with sugar in it and lie down on your left side for an hour. The baby should move at least 4 to 5 times per hour. Call right away if your baby moves less than that.   You fall, are in a car accident or any kind of trauma.   There is mental or physical violence at home.  Document Released: 10/28/2001 Document Revised: 07/16/2011 Document Reviewed: 05/02/2009 [employer] Patient Information 2012 [employer]. 

## 2011-11-18 NOTE — L&D Delivery Note (Signed)
Delivery Note At 7:20 PM a viable and healthy female was delivered via Vaginal, Spontaneous Delivery (Presentation: Right Occiput Anterior).  APGAR: 9, 10; weight 7 lb 12.5 oz (3530 g).   Placenta status: Intact, Spontaneous.  Cord: 3 vessels with the following complications: None.  Cord pH: not indicated  Anesthesia: None  Episiotomy: None Lacerations: 1st degree;Perineal, hemostatic Suture Repair: N/A Est. Blood Loss (mL): 250 ml  Mom to postpartum.  Baby to nursery-stable.  LEFTWICH-KIRBY, Aniel Hubble 01/19/2012, 8:43 PM   Attending: Nicholaus Bloom, MD

## 2011-11-27 ENCOUNTER — Telehealth: Payer: Self-pay | Admitting: *Deleted

## 2011-11-27 NOTE — Telephone Encounter (Signed)
Pt is [redacted] weeks pregnant and is tired feeling, cough since Monday and feeling clammy today. No fever as far as she knows. Has been taking Robitussin DM since Wednesday. Pt would like to know if you prefer her to be seen or any other suggestions. Please advise.

## 2011-11-27 NOTE — Telephone Encounter (Signed)
Pt notified via VM to call and schedule appt. KJ LPN

## 2011-11-27 NOTE — Telephone Encounter (Signed)
Can put on Jades sched for tomorrow.

## 2011-11-28 ENCOUNTER — Ambulatory Visit (INDEPENDENT_AMBULATORY_CARE_PROVIDER_SITE_OTHER): Payer: BC Managed Care – PPO | Admitting: Physician Assistant

## 2011-11-28 DIAGNOSIS — R319 Hematuria, unspecified: Secondary | ICD-10-CM

## 2011-11-28 DIAGNOSIS — Z3480 Encounter for supervision of other normal pregnancy, unspecified trimester: Secondary | ICD-10-CM

## 2011-11-28 MED ORDER — BENZONATATE 100 MG PO CAPS
100.0000 mg | ORAL_CAPSULE | Freq: Three times a day (TID) | ORAL | Status: DC | PRN
Start: 2011-11-28 — End: 2012-01-19

## 2011-11-28 NOTE — Progress Notes (Signed)
p-84   Blood large

## 2011-11-28 NOTE — Progress Notes (Signed)
Rx Tessalon perles, Rec: ES Mucinex, Claritin daily. Lungs: CTAB, Ears: BL fluid behind TM, + light, no effusions or bulging. Throat: clear w/o exudate.

## 2011-11-28 NOTE — Patient Instructions (Signed)
uriUpper Respiratory Infection, Adult An upper respiratory infection (URI) is also known as the common cold. It is often caused by a type of germ (virus). Colds are easily spread (contagious). You can pass it to others by kissing, coughing, sneezing, or drinking out of the same glass. Usually, you get better in 1 or 2 weeks.  HOME CARE   Only take medicine as told by your doctor.   Use a warm mist humidifier or breathe in steam from a hot shower.   Drink enough water and fluids to keep your pee (urine) clear or pale yellow.   Get plenty of rest.   Return to work when your temperature is back to normal or as told by your doctor. You may use a face mask and wash your hands to stop your cold from spreading.  GET HELP RIGHT AWAY IF:   After the first few days, you feel you are getting worse.   You have questions about your medicine.   You have chills, shortness of breath, or brown or red spit (mucus).   You have yellow or brown snot (nasal discharge) or pain in the face, especially when you bend forward.   You have a fever, puffy (swollen) neck, pain when you swallow, or white spots in the back of your throat.   You have a bad headache, ear pain, sinus pain, or chest pain.   You have a high-pitched whistling sound when you breathe in and out (wheezing).   You have a lasting cough or cough up blood.   You have sore muscles or a stiff neck.  MAKE SURE YOU:   Understand these instructions.   Will watch your condition.   Will get help right away if you are not doing well or get worse.  Document Released: 04/21/2008 Document Revised: 07/16/2011 Document Reviewed: 03/10/2011 ExitCare Patient Information 2012 [employer]. 

## 2011-11-30 LAB — CULTURE, OB URINE: Colony Count: 45000

## 2011-12-22 ENCOUNTER — Encounter: Payer: Self-pay | Admitting: Advanced Practice Midwife

## 2011-12-22 ENCOUNTER — Ambulatory Visit (INDEPENDENT_AMBULATORY_CARE_PROVIDER_SITE_OTHER): Payer: BC Managed Care – PPO | Admitting: Advanced Practice Midwife

## 2011-12-22 VITALS — BP 121/68 | Temp 97.0°F | Wt 157.0 lb

## 2011-12-22 DIAGNOSIS — Z3490 Encounter for supervision of normal pregnancy, unspecified, unspecified trimester: Secondary | ICD-10-CM

## 2011-12-22 DIAGNOSIS — Z3480 Encounter for supervision of other normal pregnancy, unspecified trimester: Secondary | ICD-10-CM

## 2011-12-22 NOTE — Progress Notes (Signed)
C/O continued congestion/mild ear pain. Ears: BL fluid behind TM, no erythema, no bulging, Nares and throat clear. Rec continued claritin, add mucinex and saline nasal spray if desired. GBS and GC/CT done. Rev'd precautions.

## 2011-12-22 NOTE — Progress Notes (Signed)
p-90  Lt ear pain and increase nausea

## 2011-12-22 NOTE — Patient Instructions (Signed)
Pregnancy - Third Trimester The third trimester of pregnancy (the last 3 months) is a period of the most rapid growth for you and your baby. The baby approaches a length of 20 inches and a weight of 6 to 10 pounds. The baby is adding on fat and getting ready for life outside your body. While inside, babies have periods of sleeping and waking, suck their thumbs, and hiccups. You can often feel small contractions of the uterus. This is false labor. It is also called Braxton-Hicks contractions. This is like a practice for labor. The usual problems in this stage of pregnancy include more difficulty breathing, swelling of the hands and feet from water retention, and having to urinate more often because of the uterus and baby pressing on your bladder.  PRENATAL EXAMS  Blood work may continue to be done during prenatal exams. These tests are done to check on your health and the probable health of your baby. Blood work is used to follow your blood levels (hemoglobin). Anemia (low hemoglobin) is common during pregnancy. Iron and vitamins are given to help prevent this. You may also continue to be checked for diabetes. Some of the past blood tests may be done again.   The size of the uterus is measured during each visit. This makes sure your baby is growing properly according to your pregnancy dates.   Your blood pressure is checked every prenatal visit. This is to make sure you are not getting toxemia.   Your urine is checked every prenatal visit for infection, diabetes and protein.   Your weight is checked at each visit. This is done to make sure gains are happening at the suggested rate and that you and your baby are growing normally.   Sometimes, an ultrasound is performed to confirm the position and the proper growth and development of the baby. This is a test done that bounces harmless sound waves off the baby so your caregiver can more accurately determine due dates.   Discuss the type of pain  medication and anesthesia you will have during your labor and delivery.   Discuss the possibility and anesthesia if a Cesarean Section might be necessary.   Inform your caregiver if there is any mental or physical violence at home.  Sometimes, a specialized non-stress test, contraction stress test and biophysical profile are done to make sure the baby is not having a problem. Checking the amniotic fluid surrounding the baby is called an amniocentesis. The amniotic fluid is removed by sticking a needle into the belly (abdomen). This is sometimes done near the end of pregnancy if an early delivery is required. In this case, it is done to help make sure the baby's lungs are mature enough for the baby to live outside of the womb. If the lungs are not mature and it is unsafe to deliver the baby, an injection of cortisone medication is given to the mother 1 to 2 days before the delivery. This helps the baby's lungs mature and makes it safer to deliver the baby. CHANGES OCCURING IN THE THIRD TRIMESTER OF PREGNANCY Your body goes through many changes during pregnancy. They vary from person to person. Talk to your caregiver about changes you notice and are concerned about.  During the last trimester, you have probably had an increase in your appetite. It is normal to have cravings for certain foods. This varies from person to person and pregnancy to pregnancy.   You may begin to get stretch marks on your hips,   abdomen, and breasts. These are normal changes in the body during pregnancy. There are no exercises or medications to take which prevent this change.   Constipation may be treated with a stool softener or adding bulk to your diet. Drinking lots of fluids, fiber in vegetables, fruits, and whole grains are helpful.   Exercising is also helpful. If you have been very active up until your pregnancy, most of these activities can be continued during your pregnancy. If you have been less active, it is helpful  to start an exercise program such as walking. Consult your caregiver before starting exercise programs.   Avoid all smoking, alcohol, un-prescribed drugs, herbs and "street drugs" during your pregnancy. These chemicals affect the formation and growth of the baby. Avoid chemicals throughout the pregnancy to ensure the delivery of a healthy infant.   Backache, varicose veins and hemorrhoids may develop or get worse.   You will tire more easily in the third trimester, which is normal.   The baby's movements may be stronger and more often.   You may become short of breath easily.   Your belly button may stick out.   A yellow discharge may leak from your breasts called colostrum.   You may have a bloody mucus discharge. This usually occurs a few days to a week before labor begins.  HOME CARE INSTRUCTIONS   Keep your caregiver's appointments. Follow your caregiver's instructions regarding medication use, exercise, and diet.   During pregnancy, you are providing food for you and your baby. Continue to eat regular, well-balanced meals. Choose foods such as meat, fish, milk and other low fat dairy products, vegetables, fruits, and whole-grain breads and cereals. Your caregiver will tell you of the ideal weight gain.   A physical sexual relationship may be continued throughout pregnancy if there are no other problems such as early (premature) leaking of amniotic fluid from the membranes, vaginal bleeding, or belly (abdominal) pain.   Exercise regularly if there are no restrictions. Check with your caregiver if you are unsure of the safety of your exercises. Greater weight gain will occur in the last 2 trimesters of pregnancy. Exercising helps:   Control your weight.   Get you in shape for labor and delivery.   You lose weight after you deliver.   Rest a lot with legs elevated, or as needed for leg cramps or low back pain.   Wear a good support or jogging bra for breast tenderness during  pregnancy. This may help if worn during sleep. Pads or tissues may be used in the bra if you are leaking colostrum.   Do not use hot tubs, steam rooms, or saunas.   Wear your seat belt when driving. This protects you and your baby if you are in an accident.   Avoid raw meat, cat litter boxes and soil used by cats. These carry germs that can cause birth defects in the baby.   It is easier to loose urine during pregnancy. Tightening up and strengthening the pelvic muscles will help with this problem. You can practice stopping your urination while you are going to the bathroom. These are the same muscles you need to strengthen. It is also the muscles you would use if you were trying to stop from passing gas. You can practice tightening these muscles up 10 times a set and repeating this about 3 times per day. Once you know what muscles to tighten up, do not perform these exercises during urination. It is more likely   to cause an infection by backing up the urine.   Ask for help if you have financial, counseling or nutritional needs during pregnancy. Your caregiver will be able to offer counseling for these needs as well as refer you for other special needs.   Make a list of emergency phone numbers and have them available.   Plan on getting help from family or friends when you go home from the hospital.   Make a trial run to the hospital.   Take prenatal classes with the father to understand, practice and ask questions about the labor and delivery.   Prepare the baby's room/nursery.   Do not travel out of the city unless it is absolutely necessary and with the advice of your caregiver.   Wear only low or no heal shoes to have better balance and prevent falling.  MEDICATIONS AND DRUG USE IN PREGNANCY  Take prenatal vitamins as directed. The vitamin should contain 1 milligram of folic acid. Keep all vitamins out of reach of children. Only a couple vitamins or tablets containing iron may be fatal  to a baby or young child when ingested.   Avoid use of all medications, including herbs, over-the-counter medications, not prescribed or suggested by your caregiver. Only take over-the-counter or prescription medicines for pain, discomfort, or fever as directed by your caregiver. Do not use aspirin, ibuprofen (Motrin, Advil, Nuprin) or naproxen (Aleve) unless OK'd by your caregiver.   Let your caregiver also know about herbs you may be using.   Alcohol is related to a number of birth defects. This includes fetal alcohol syndrome. All alcohol, in any form, should be avoided completely. Smoking will cause low birth rate and premature babies.   Street/illegal drugs are very harmful to the baby. They are absolutely forbidden. A baby born to an addicted mother will be addicted at birth. The baby will go through the same withdrawal an adult does.  SEEK MEDICAL CARE IF: You have any concerns or worries during your pregnancy. It is better to call with your questions if you feel they cannot wait, rather than worry about them. DECISIONS ABOUT CIRCUMCISION You may or may not know the sex of your baby. If you know your baby is a boy, it may be time to think about circumcision. Circumcision is the removal of the foreskin of the penis. This is the skin that covers the sensitive end of the penis. There is no proven medical need for this. Often this decision is made on what is popular at the time or based upon religious beliefs and social issues. You can discuss these issues with your caregiver or pediatrician. SEEK IMMEDIATE MEDICAL CARE IF:   An unexplained oral temperature above 102 F (38.9 C) develops, or as your caregiver suggests.   You have leaking of fluid from the vagina (birth canal). If leaking membranes are suspected, take your temperature and tell your caregiver of this when you call.   There is vaginal spotting, bleeding or passing clots. Tell your caregiver of the amount and how many pads are  used.   You develop a bad smelling vaginal discharge with a change in the color from clear to white.   You develop vomiting that lasts more than 24 hours.   You develop chills or fever.   You develop shortness of breath.   You develop burning on urination.   You loose more than 2 pounds of weight or gain more than 2 pounds of weight or as suggested by your   caregiver.   You notice sudden swelling of your face, hands, and feet or legs.   You develop belly (abdominal) pain. Round ligament discomfort is a common non-cancerous (benign) cause of abdominal pain in pregnancy. Your caregiver still must evaluate you.   You develop a severe headache that does not go away.   You develop visual problems, blurred or double vision.   If you have not felt your baby move for more than 1 hour. If you think the baby is not moving as much as usual, eat something with sugar in it and lie down on your left side for an hour. The baby should move at least 4 to 5 times per hour. Call right away if your baby moves less than that.   You fall, are in a car accident or any kind of trauma.   There is mental or physical violence at home.  Document Released: 10/28/2001 Document Revised: 07/16/2011 Document Reviewed: 05/02/2009 [employer] Patient Information 2012 [employer]. 

## 2011-12-23 LAB — GC/CHLAMYDIA PROBE AMP, GENITAL: Chlamydia, DNA Probe: NEGATIVE

## 2011-12-29 ENCOUNTER — Ambulatory Visit (INDEPENDENT_AMBULATORY_CARE_PROVIDER_SITE_OTHER): Payer: BC Managed Care – PPO | Admitting: Family

## 2011-12-29 DIAGNOSIS — Z3490 Encounter for supervision of normal pregnancy, unspecified, unspecified trimester: Secondary | ICD-10-CM

## 2011-12-29 DIAGNOSIS — Z3480 Encounter for supervision of other normal pregnancy, unspecified trimester: Secondary | ICD-10-CM

## 2011-12-29 NOTE — Progress Notes (Signed)
p-92,  Dry cough.  Pt wants cervix checked  Small amt of blood in urine dip

## 2011-12-29 NOTE — Progress Notes (Signed)
Reports feeling increased Sherry Page; here with husband; reviewed signs of labor and when to go to hospital.

## 2012-01-01 ENCOUNTER — Telehealth: Payer: Self-pay | Admitting: *Deleted

## 2012-01-01 DIAGNOSIS — O2240 Hemorrhoids in pregnancy, unspecified trimester: Secondary | ICD-10-CM

## 2012-01-01 MED ORDER — HYDROCORTISONE ACE-PRAMOXINE 1-1 % RE FOAM: 1.0000 | Freq: Three times a day (TID) | RECTAL | Status: AC

## 2012-01-01 NOTE — Telephone Encounter (Signed)
Pt came by the office with c/o's of bleeding and painful hemorrhoid.  Hemorrhoid does not appear to be thrombosed.  It is soft and pink without any bluish hues.  Advised increasing water, use colace 2 pills daily or may use Miralax.  She is to use Tucks prn.  Spoke with Dr. Marice Potter and RX for Pro[facility]ofo[facility] called to [facility].  Pt to f/u on Mon @ regular OBF appt.

## 2012-01-05 ENCOUNTER — Ambulatory Visit (INDEPENDENT_AMBULATORY_CARE_PROVIDER_SITE_OTHER): Payer: BC Managed Care – PPO | Admitting: Physician Assistant

## 2012-01-05 DIAGNOSIS — Z3480 Encounter for supervision of other normal pregnancy, unspecified trimester: Secondary | ICD-10-CM

## 2012-01-05 NOTE — Patient Instructions (Signed)

## 2012-01-05 NOTE — Progress Notes (Signed)
p-88  Wants cervix checked 

## 2012-01-05 NOTE — Progress Notes (Signed)
Hemorrhoid healed. + FM. No blding, LOF. Irregular ctx. Labor precautiosn

## 2012-01-12 ENCOUNTER — Ambulatory Visit (INDEPENDENT_AMBULATORY_CARE_PROVIDER_SITE_OTHER): Payer: BC Managed Care – PPO | Admitting: Advanced Practice Midwife

## 2012-01-12 VITALS — BP 125/72 | Temp 98.5°F | Wt 161.0 lb

## 2012-01-12 DIAGNOSIS — Z3480 Encounter for supervision of other normal pregnancy, unspecified trimester: Secondary | ICD-10-CM

## 2012-01-12 DIAGNOSIS — O368190 Decreased fetal movements, unspecified trimester, not applicable or unspecified: Secondary | ICD-10-CM

## 2012-01-12 NOTE — Progress Notes (Signed)
p-92 

## 2012-01-12 NOTE — Progress Notes (Addendum)
Decreased FM x several days. Active on exam. NST initially 170's (drinking coffee), then 140's, category I. FKCs

## 2012-01-12 NOTE — Patient Instructions (Signed)

## 2012-01-14 ENCOUNTER — Ambulatory Visit (HOSPITAL_COMMUNITY)
Admission: RE | Admit: 2012-01-14 | Discharge: 2012-01-14 | Disposition: A | Discharge status: Home / Self Care | Payer: BC Managed Care – PPO | Source: Ambulatory Visit | Attending: Obstetrics & Gynecology | Admitting: Obstetrics & Gynecology

## 2012-01-14 ENCOUNTER — Encounter: Payer: Self-pay | Admitting: Obstetrics & Gynecology

## 2012-01-14 ENCOUNTER — Ambulatory Visit: Payer: BC Managed Care – PPO | Admitting: Obstetrics & Gynecology

## 2012-01-14 DIAGNOSIS — O368190 Decreased fetal movements, unspecified trimester, not applicable or unspecified: Secondary | ICD-10-CM

## 2012-01-14 DIAGNOSIS — Z3490 Encounter for supervision of normal pregnancy, unspecified, unspecified trimester: Secondary | ICD-10-CM

## 2012-01-14 DIAGNOSIS — O365990 Maternal care for other known or suspected poor fetal growth, unspecified trimester, not applicable or unspecified: Secondary | ICD-10-CM | POA: Insufficient documentation

## 2012-01-14 NOTE — Progress Notes (Signed)
p-87   Small blood

## 2012-01-14 NOTE — Progress Notes (Signed)
Will get NST for FH at 170.  Also [place] for growth and AFI for S<D.

## 2012-01-15 ENCOUNTER — Encounter: Payer: Self-pay | Admitting: *Deleted

## 2012-01-19 ENCOUNTER — Encounter: Payer: Self-pay | Admitting: Obstetrics and Gynecology

## 2012-01-19 ENCOUNTER — Encounter (HOSPITAL_COMMUNITY): Payer: Self-pay | Admitting: *Deleted

## 2012-01-19 ENCOUNTER — Ambulatory Visit (INDEPENDENT_AMBULATORY_CARE_PROVIDER_SITE_OTHER): Payer: BC Managed Care – PPO | Admitting: Obstetrics and Gynecology

## 2012-01-19 ENCOUNTER — Inpatient Hospital Stay (HOSPITAL_COMMUNITY)
Admission: AD | Admit: 2012-01-19 | Discharge: 2012-01-21 | DRG: 373 | Disposition: A | Discharge status: Home / Self Care | Payer: BC Managed Care – PPO | Source: Ambulatory Visit | Attending: Obstetrics and Gynecology | Admitting: Obstetrics and Gynecology

## 2012-01-19 VITALS — BP 133/75 | Temp 98.6°F | Wt 161.0 lb

## 2012-01-19 DIAGNOSIS — IMO0001 Reserved for inherently not codable concepts without codable children: Secondary | ICD-10-CM

## 2012-01-19 DIAGNOSIS — Z3490 Encounter for supervision of normal pregnancy, unspecified, unspecified trimester: Secondary | ICD-10-CM

## 2012-01-19 DIAGNOSIS — Z3480 Encounter for supervision of other normal pregnancy, unspecified trimester: Secondary | ICD-10-CM

## 2012-01-19 DIAGNOSIS — G4700 Insomnia, unspecified: Secondary | ICD-10-CM

## 2012-01-19 DIAGNOSIS — O09299 Supervision of pregnancy with other poor reproductive or obstetric history, unspecified trimester: Secondary | ICD-10-CM

## 2012-01-19 DIAGNOSIS — Z3493 Encounter for supervision of normal pregnancy, unspecified, third trimester: Secondary | ICD-10-CM

## 2012-01-19 HISTORY — DX: Urinary tract infection, site not specified: N39.0

## 2012-01-19 LAB — CBC
Hemoglobin: 14.4 g/dL (ref 12.0–15.0)
MCHC: 34.3 g/dL (ref 30.0–36.0)
RBC: 4.37 MIL/uL (ref 3.87–5.11)
WBC: 15.2 10*3/uL — ABNORMAL HIGH (ref 4.0–10.5)

## 2012-01-19 LAB — RPR: RPR Ser Ql: NONREACTIVE

## 2012-01-19 LAB — STREP B DNA PROBE: GBS: NEGATIVE

## 2012-01-19 MED ORDER — IBUPROFEN 600 MG PO TABS
600.0000 mg | ORAL_TABLET | Freq: Four times a day (QID) | ORAL | Status: DC
  Administered 2012-01-20 – 2012-01-21 (×6): 600 mg via ORAL
  Filled 2012-01-19 (×6): qty 1

## 2012-01-19 MED ORDER — LANOLIN HYDROUS EX OINT: TOPICAL_OINTMENT | CUTANEOUS | Status: DC | PRN

## 2012-01-19 MED ORDER — CITRIC ACID-SODIUM CITRATE 334-500 MG/5ML PO SOLN: 30.0000 mL | ORAL | Status: DC | PRN

## 2012-01-19 MED ORDER — OXYCODONE-ACETAMINOPHEN 5-325 MG PO TABS: 1.0000 | ORAL_TABLET | ORAL | Status: DC | PRN

## 2012-01-19 MED ORDER — LACTATED RINGERS IV SOLN: 500.0000 mL | INTRAVENOUS | Status: DC | PRN

## 2012-01-19 MED ORDER — ONDANSETRON HCL 4 MG/2ML IJ SOLN
4.0000 mg | Freq: Four times a day (QID) | INTRAMUSCULAR | Status: DC | PRN
  Administered 2012-01-19: 4 mg via INTRAVENOUS
  Filled 2012-01-19: qty 2

## 2012-01-19 MED ORDER — BENZOCAINE-MENTHOL 20-0.5 % EX AERO: 1.0000 "application " | INHALATION_SPRAY | CUTANEOUS | Status: DC | PRN

## 2012-01-19 MED ORDER — OXYTOCIN BOLUS FROM INFUSION
500.0000 mL | Freq: Once | INTRAVENOUS | Status: DC
  Filled 2012-01-19: qty 500

## 2012-01-19 MED ORDER — DIPHENHYDRAMINE HCL 25 MG PO CAPS: 25.0000 mg | ORAL_CAPSULE | Freq: Four times a day (QID) | ORAL | Status: DC | PRN

## 2012-01-19 MED ORDER — IBUPROFEN 600 MG PO TABS: 600.0000 mg | ORAL_TABLET | Freq: Four times a day (QID) | ORAL | Status: DC | PRN

## 2012-01-19 MED ORDER — SIMETHICONE 80 MG PO CHEW: 80.0000 mg | CHEWABLE_TABLET | ORAL | Status: DC | PRN

## 2012-01-19 MED ORDER — FLEET ENEMA 7-19 GM/118ML RE ENEM: 1.0000 | ENEMA | RECTAL | Status: DC | PRN

## 2012-01-19 MED ORDER — PRENATAL MULTIVITAMIN CH
1.0000 | ORAL_TABLET | Freq: Every day | ORAL | Status: DC
  Administered 2012-01-20 – 2012-01-21 (×2): 1 via ORAL
  Filled 2012-01-19 (×2): qty 1

## 2012-01-19 MED ORDER — NALBUPHINE SYRINGE 5 MG/0.5 ML
5.0000 mg | INJECTION | INTRAMUSCULAR | Status: DC | PRN
  Administered 2012-01-19: 10 mg via INTRAVENOUS
  Filled 2012-01-19: qty 1

## 2012-01-19 MED ORDER — OXYTOCIN 20 UNITS IN LACTATED RINGERS INFUSION - SIMPLE
125.0000 mL/h | Freq: Once | INTRAVENOUS | Status: DC
  Filled 2012-01-19: qty 1000

## 2012-01-19 MED ORDER — ONDANSETRON HCL 4 MG PO TABS: 4.0000 mg | ORAL_TABLET | ORAL | Status: DC | PRN

## 2012-01-19 MED ORDER — ONDANSETRON HCL 4 MG/2ML IJ SOLN: 4.0000 mg | INTRAMUSCULAR | Status: DC | PRN

## 2012-01-19 MED ORDER — WITCH HAZEL-GLYCERIN EX PADS: 1.0000 "application " | MEDICATED_PAD | CUTANEOUS | Status: DC | PRN

## 2012-01-19 MED ORDER — ZOLPIDEM TARTRATE 5 MG PO TABS: 5.0000 mg | ORAL_TABLET | Freq: Every evening | ORAL | Status: DC | PRN

## 2012-01-19 MED ORDER — TETANUS-DIPHTH-ACELL PERTUSSIS 5-2.5-18.5 LF-MCG/0.5 IM SUSP: 0.5000 mL | Freq: Once | INTRAMUSCULAR | Status: DC

## 2012-01-19 MED ORDER — ACETAMINOPHEN 325 MG PO TABS: 650.0000 mg | ORAL_TABLET | ORAL | Status: DC | PRN

## 2012-01-19 MED ORDER — SENNOSIDES-DOCUSATE SODIUM 8.6-50 MG PO TABS
2.0000 | ORAL_TABLET | Freq: Every day | ORAL | Status: DC
  Administered 2012-01-19 – 2012-01-20 (×2): 2 via ORAL

## 2012-01-19 MED ORDER — DIBUCAINE 1 % RE OINT
1.0000 "application " | TOPICAL_OINTMENT | RECTAL | Status: DC | PRN
  Administered 2012-01-21: 1 via RECTAL
  Filled 2012-01-19: qty 28

## 2012-01-19 MED ORDER — ZOLPIDEM TARTRATE ER 6.25 MG PO TBCR: 6.2500 mg | EXTENDED_RELEASE_TABLET | Freq: Every evening | ORAL | Status: DC | PRN

## 2012-01-19 MED ORDER — LIDOCAINE HCL (PF) 1 % IJ SOLN
30.0000 mL | INTRAMUSCULAR | Status: DC | PRN
  Filled 2012-01-19: qty 30

## 2012-01-19 MED ORDER — LACTATED RINGERS IV SOLN
INTRAVENOUS | Status: DC
  Administered 2012-01-19: 18:00:00 via INTRAVENOUS

## 2012-01-19 NOTE — H&P (Signed)
AAHANA ELZA is a 31 y.o. female G3P1011 pt of Kathryne Sharper presenting for labor evaluation.  Pt reports irregular ctx all night last night with onset of regular painful contractions today at noon, following OB visit and sweeping of membranes in the office.  Ctx are currently 4-5 minutes apart.  She feels normal fetal movement, and denies LOF, vaginal bleeding, vaginal itching/burning, urinary symptoms, h/a, n/v, dizziness, or fever/chills.  Maternal Medical History:  Reason for admission: Reason for admission: contractions.  Reason for Admission:   nauseaContractions: Onset was 6-12 hours ago.   Frequency: regular.   Duration is approximately 1 minute.   Perceived severity is strong.    Fetal activity: Perceived fetal activity is normal.   Last perceived fetal movement was within the past 24 hours.    Prenatal complications: no prenatal complications Prenatal Complications - Diabetes: none.    OB History    Grav Para Term Preterm Abortions TAB SAB Ect Mult Living   3 1 1  0 1 1 0 0 0 1     Past Medical History  Diagnosis Date  . Gestational diabetes 2010    Glucometer testing 4x daily  . 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 Anal fissure 2010    after last pregnancy  . Urinary tract infection   . Hemorrhoids    Past Surgical History  Procedure Date  . Induced abortion    Family History: family history includes Anemia in her sister; Depression in her paternal grandmother; Diabetes in her paternal grandmother; Heart attack in her maternal grandmother; Heart disease in her father and maternal grandmother; Heart failure in her father; and Stroke in her maternal grandfather.  There is no history of Anesthesia problems. Social History:  reports that she has quit smoking. Her smoking use included Cigarettes. She has a 2.5 pack-year smoking history. She has never used smokeless tobacco. She reports that she does not drink alcohol or use illicit drugs.  Review of  Systems  Constitutional: Negative for fever, chills and malaise/fatigue.  Eyes: Negative for blurred vision.  Respiratory: Negative for cough and shortness of breath.   Cardiovascular: Negative for chest pain.  Gastrointestinal: Negative for heartburn, nausea and vomiting.  Genitourinary: Negative for dysuria, urgency and frequency.  Neurological: Negative for dizziness and headaches.  Psychiatric/Behavioral: Negative for depression and substance abuse.    Dilation: 5 Effacement (%): 80 Exam by:: Estill Bamberg, CNM Blood pressure 119/78, pulse 96, temperature 97.6 F (36.4 C), temperature source Axillary, resp. rate 20, height 5\' 8"  (1.727 m), last menstrual period 04/15/2011. Maternal Exam:  Uterine Assessment: Contraction strength is moderate.  Contraction duration is 70 seconds. Contraction frequency is regular.   Abdomen: Patient reports no abdominal tenderness. Fundal height is 37 cm.   Fetal presentation: vertex  Cervix: Cervix evaluated by digital exam.     Fetal Exam Fetal Monitor Review: Mode: ultrasound.   Baseline rate: 140.  Variability: moderate (6-25 bpm).   Pattern: accelerations present and no decelerations.    Fetal State Assessment: Category I - tracings are normal.     Physical Exam  Nursing note and vitals reviewed. Constitutional: She is oriented to person, place, and time. She appears well-developed and well-nourished.  Neck: Normal range of motion.  Cardiovascular: Normal rate, regular rhythm and normal heart sounds.   Respiratory: Effort normal and breath sounds normal.  GI: Soft.  Genitourinary:       Cervix 5/80/-2, posterior, vertex fetal position  Musculoskeletal: Normal range  of motion.  Neurological: She is alert and oriented to person, place, and time. She has normal reflexes.  Skin: Skin is warm and dry.  Psychiatric: She has a normal mood and affect. Her behavior is normal. Judgment and thought content normal.    Prenatal labs: ABO, Rh:  A/POS/-- (07/19 0910) Antibody: NEG (07/19 0910) Rubella: 16.9 (07/19 0910) RPR: NON REAC (11/21 0915)  HBsAg: NEGATIVE (07/19 0910)  HIV: NON REACTIVE (11/21 0915)  GBS:   Negative on 12/22/11  Assessment/Plan: A: Active labor   P: Admit to birthing suites Intermittent fetal monitoring  Anticipate vaginal delivery  LEFTWICH-KIRBY, Romuald Mccaslin 01/19/2012, 2:58 PM

## 2012-01-19 NOTE — Patient Instructions (Signed)
Breastfeeding BENEFITS OF BREASTFEEDING For the baby  The first milk (colostrum) helps the baby's digestive system function better.   There are antibodies from the mother in the milk that help the baby fight off infections.   The baby has a lower incidence of asthma, allergies, and SIDS (sudden infant death syndrome).   The nutrients in breast milk are better than formulas for the baby and helps the baby's brain grow better.   Babies who breastfeed have less gas, colic, and constipation.  For the mother  Breastfeeding helps develop a very special bond between mother and baby.   It is more convenient, always available at the correct temperature and cheaper than formula feeding.   It burns calories in the mother and helps with losing weight that was gained during pregnancy.   It makes the uterus contract back down to normal size faster and slows bleeding following delivery.   Breastfeeding mothers have a lower risk of developing breast cancer.  NURSE FREQUENTLY  A healthy, full-term baby may breastfeed as often as every hour or space his or her feedings to every 3 hours.   How often to nurse will vary from baby to baby. Watch your baby for signs of hunger, not the clock.   Nurse as often as the baby requests, or when you feel the need to reduce the fullness of your breasts.   Awaken the baby if it has been 3 to 4 hours since the last feeding.   Frequent feeding will help the mother make more milk and will prevent problems like sore nipples and engorgement of the breasts.  BABY'S POSITION AT THE BREAST  Whether lying down or sitting, be sure that the baby's tummy is facing your tummy.   Support the breast with 4 fingers underneath the breast and the thumb above. Make sure your fingers are well away from the nipple and baby's mouth.   Stroke the baby's lips and cheek closest to the breast gently with your finger or nipple.   When the baby's mouth is open wide enough, place all  of your nipple and as much of the dark area around the nipple as possible into your baby's mouth.   Pull the baby in close so the tip of the nose and the baby's cheeks touch the breast during the feeding.  FEEDINGS  The length of each feeding varies from baby to baby and from feeding to feeding.   The baby must suck about 2 to 3 minutes for your milk to get to him or her. This is called a "let down." For this reason, allow the baby to feed on each breast as long as he or she wants. Your baby will end the feeding when he or she has received the right balance of nutrients.   To break the suction, put your finger into the corner of the baby's mouth and slide it between his or her gums before removing your breast from his or her mouth. This will help prevent sore nipples.  REDUCING BREAST ENGORGEMENT  In the first week after your baby is born, you may experience signs of breast engorgement. When breasts are engorged, they feel heavy, warm, full, and may be tender to the touch. You can reduce engorgement if you:   Nurse frequently, every 2 to 3 hours. Mothers who breastfeed early and often have fewer problems with engorgement.   Place light ice packs on your breasts between feedings. This reduces swelling. Wrap the ice packs in a   lightweight towel to protect your skin.   Apply moist hot packs to your breast for 5 to 10 minutes before each feeding. This increases circulation and helps the milk flow.   Gently massage your breast before and during the feeding.   Make sure that the baby empties at least one breast at every feeding before switching sides.   Use a breast pump to empty the breasts if your baby is sleepy or not nursing well. You may also want to pump if you are returning to work or or you feel you are getting engorged.   Avoid bottle feeds, pacifiers or supplemental feedings of water or juice in place of breastfeeding.   Be sure the baby is latched on and positioned properly while  breastfeeding.   Prevent fatigue, stress, and anemia.   Wear a supportive bra, avoiding underwire styles.   Eat a balanced diet with enough fluids.  If you follow these suggestions, your engorgement should improve in 24 to 48 hours. If you are still experiencing difficulty, call your lactation consultant or caregiver. IS MY BABY GETTING ENOUGH MILK? Sometimes, mothers worry about whether their babies are getting enough milk. You can be assured that your baby is getting enough milk if:  The baby is actively sucking and you hear swallowing.   The baby nurses at least 8 to 12 times in a 24 hour time period. Nurse your baby until he or she unlatches or falls asleep at the first breast (at least 10 to 20 minutes), then offer the second side.   The baby is wetting 5 to 6 disposable diapers (6 to 8 cloth diapers) in a 24 hour period by 5 to 6 days of age.   The baby is having at least 2 to 3 stools every 24 hours for the first few months. Breast milk is all the food your baby needs. It is not necessary for your baby to have water or formula. In fact, to help your breasts make more milk, it is best not to give your baby supplemental feedings during the early weeks.   The stool should be soft and yellow.   The baby should gain 4 to 7 ounces per week after he is 4 days old.  TAKE CARE OF YOURSELF Take care of your breasts by:  Bathing or showering daily.   Avoiding the use of soaps on your nipples.   Start feedings on your left breast at one feeding and on your right breast at the next feeding.   You will notice an increase in your milk supply 2 to 5 days after delivery. You may feel some discomfort from engorgement, which makes your breasts very firm and often tender. Engorgement "peaks" out within 24 to 48 hours. In the meantime, apply warm moist towels to your breasts for 5 to 10 minutes before feeding. Gentle massage and expression of some milk before feeding will soften your breasts, making  it easier for your baby to latch on. Wear a well fitting nursing bra and air dry your nipples for 10 to 15 minutes after each feeding.   Only use cotton bra pads.   Only use pure lanolin on your nipples after nursing. You do not need to wash it off before nursing.  Take care of yourself by:   Eating well-balanced meals and nutritious snacks.   Drinking milk, fruit juice, and water to satisfy your thirst (about 8 glasses a day).   Getting plenty of rest.   Increasing calcium in   your diet (1200 mg a day).   Avoiding foods that you notice affect the baby in a bad way.  SEEK MEDICAL CARE IF:   You have any questions or difficulty with breastfeeding.   You need help.   You have a hard, red, sore area on your breast, accompanied by a fever of 100.5 F (38.1 C) or more.   Your baby is too sleepy to eat well or is having trouble sleeping.   Your baby is wetting less than 6 diapers per day, by 5 days of age.   Your baby's skin or white part of his or her eyes is more yellow than it was in the hospital.   You feel depressed.  Document Released: 11/03/2005 Document Revised: 10/23/2011 Document Reviewed: 06/18/2009 ExitCare Patient Information 2012 [employer]. 

## 2012-01-19 NOTE — Consult Note (Signed)
Lactation note.  Baby is less than 4 hours old and mother is c/o bilateral nipple and areolar pain along with burning and searing pain with BF and to touch.  Both areolas are pale pink except for a narrow borders which are brown.  She reports a history of yeast infections.  She battled thrush while breast feeding her last baby.  Attempted to call her CNM but call was not picked up.  Gave phone number to RN so that she could follow up with her.  Discouraged lanolin and comfort gels(requested but not given).  Advised sore nipples shells and expressed BM for now.

## 2012-01-19 NOTE — Progress Notes (Signed)
Ctx q 5 for past 2 hrs, lasting 30-45 seconds.   Was in office this morning was 3 cm, membranes stripped. 2nd baby.  Take directly to rm after using restroom

## 2012-01-19 NOTE — Progress Notes (Signed)
   Subjective: Pt reports increased pain with contraction.  Objective: BP 111/70  Pulse 88  Temp(Src) 98.3 F (36.8 C) (Oral)  Resp 18  Ht 5\' 8"  (1.727 m)  LMP 04/15/2011      FHT:  FHR: 140's bpm, variability: moderate,  accelerations:  Present,  decelerations:  Absent UC:   regular, every 3-4 minutes with palpation SVE:   Dilation: 6.5 Effacement (%): 100 Station: -1 Exam by:: Sid Falcon, cnm  Labs: Lab Results  Component Value Date   WBC 15.2* 01/19/2012   HGB 14.4 01/19/2012   HCT 42.0 01/19/2012   MCV 96.1 01/19/2012   PLT 184 01/19/2012    Assessment / Plan: Spontaneous labor, progressing normally  Labor: Progressing normally Preeclampsia:  n/a Fetal Wellbeing:  Category I Pain Control:  Labor support without medications I/D:  n/a Anticipated MOD:  NSVD  [facility] 01/19/2012, 5:05 PM

## 2012-01-19 NOTE — Progress Notes (Signed)
[place] EFW last wk &#6. Requests membranes stripped. Contracting intermittently x3 days and not sleeping. Rx Ambien 5 mg 1 hs prn #5.

## 2012-01-19 NOTE — Progress Notes (Signed)
p=91 

## 2012-01-20 ENCOUNTER — Telehealth: Payer: Self-pay | Admitting: *Deleted

## 2012-01-20 DIAGNOSIS — O9229 Other disorders of breast associated with pregnancy and the puerperium: Secondary | ICD-10-CM

## 2012-01-20 MED ORDER — CLOTRIMAZOLE 1 % EX CREA
TOPICAL_CREAM | Freq: Two times a day (BID) | CUTANEOUS | Status: DC
  Administered 2012-01-20 – 2012-01-21 (×3): via TOPICAL
  Filled 2012-01-20: qty 15

## 2012-01-20 MED ORDER — AMBULATORY NON FORMULARY MEDICATION: Status: DC

## 2012-01-20 NOTE — Progress Notes (Signed)
Patient's nipples and areolas sore and pink. Notified Dorathy Kinsman CNM with lactation's recommendations. Order to be placed. Will continue to monitor and give shells.

## 2012-01-20 NOTE — Telephone Encounter (Signed)
Pt called stating that Lactation consultant recommended All purpose nipple cream to be used prn.  This is compounded at [facility].  RX called to [facility] [employer].

## 2012-01-20 NOTE — Progress Notes (Signed)
Post Partum Day 1 Subjective: up ad lib and voiding Waiting for breakfast. No N/V. Has bilateral breast pain. Left breast with some bleeding.  Objective: Blood pressure 104/68, pulse 83, temperature 98.2 F (36.8 C), temperature source Oral, resp. rate 18, height 5\' 8"  (1.727 m), weight 73.029 kg (161 lb), last menstrual period 04/15/2011, unknown if currently breastfeeding.  Physical Exam:  General: alert, cooperative and no distress Lochia: appropriate Uterine Fundus: firm Incision: n/a DVT Evaluation: No evidence of DVT seen on physical exam. Negative Homan's sign.   Basename 01/19/12 1536  HGB 14.4  HCT 42.0    Assessment/Plan: Plan for discharge tomorrow, Breastfeeding and Lactation consult Patient is a midwife only patient Will have midwife evaluate breast discomfort/bleeding Lactation Consult Continue routine post-partum care.   LOS: 1 day   Ala Dach 01/20/2012, 9:31 AM

## 2012-01-21 MED ORDER — IBUPROFEN 600 MG PO TABS: 600.0000 mg | ORAL_TABLET | Freq: Four times a day (QID) | ORAL | Status: AC

## 2012-01-21 MED ORDER — BENZOCAINE-MENTHOL 20-0.5 % EX AERO
INHALATION_SPRAY | CUTANEOUS | Status: AC
  Administered 2012-01-21: 10:00:00
  Filled 2012-01-21: qty 56

## 2012-01-21 MED ORDER — PRENATAL MULTIVITAMIN CH: 1.0000 | ORAL_TABLET | Freq: Every day | ORAL | Status: DC

## 2012-01-21 NOTE — Discharge Instructions (Signed)
Breast Pumping Tips Pumping your breast milk is a good way to stimulate milk production and have a steady supply of breast milk for your infant. Pumping is most helpful during your infant's growth spurts, when involving dad or a family member, or when you are away. There are several types of pumps available. They can be purchased at a baby or maternity store. You can begin pumping soon after delivery, but some experts believe that you should wait about four weeks to give your infant a bottle. In general, the more you breastfeed or pump, the more milk you will have for your infant. It is also important to take good care of yourself. This will reduce stress and help your body to create a healthy supply of milk. Your caregiver or lactation consultant can give you the information and support you need in your efforts to breastfeed your infant. PUMPING BREAST MILK  Follow the tips below for successful breast pumping. Take care of yourself.  Drink enough water or fluids to keep urine clear or pale yellow. You may notice a thirsty feeling while breastfeeding. This is because your body needs more water to make breast milk. Keep a large water bottle handy. Make healthy drink choices such as unsweetened fruit juice, milk and water. Limit soda, coffee, and alcohol (wait 2 hours to feed or pump if you have an alcoholic drink.)   Eat a healthy, well-balanced diet rich in fruits, vegetables, and whole grains.   Exercise as recommended by your caregiver.   Get plenty of sleep. Sleep when your infant sleeps. Ask friends and family for help if you need time to nap or rest.   Do not smoke. Smoking can lower your milk supply and harm your infant. If you need help quitting, ask your caregiver for a program recommendation.   Ask your caregiver about birth control options. Birth control pills may lower your milk supply. You may be advised to use condoms or other forms of birth control.  Relax and pump Stimulating your  let-down reflex is the key to successful and effective pumping. This makes the milk in all parts of the breast flow more freely.   It is easier to pump breast milk (and breastfeed) while you are relaxed. Find techniques that work for you. Quiet private spaces, breast massage, soothing heat placed on the breast, music, and pictures or a tape recording of your infant may help you to relax and "let down" your milk. If you have difficulty with your let down, try smelling one of your infant's blankets or an item of clothing he or she has worn while you are pumping.   When pumping, place the special suction cup (flange) directly over the nipple. It may be uncomfortable and cause nipple damage if it is not placed properly or is the wrong size. Applying a small amount of purified or modified lanolin to your nipple and the areola may help increase your comfort level. Also, you can change the speed and suction of many electric pumps to your comfort level. Your caregiver or lactation consultant can help you with this.   If pumping continues to be painful, or you feel you are not getting very much milk when you pump, you may need a different type of pump. A lactation consultant can help you determine if this is the case.   If you are with your infant, feed him or her on demand and try pumping after each feeding. This will boost your production, even if milk   does not come out. You may not be able to pump much milk at first, but keep up the routine, and this will change.   If you are working or away from your infant for several hours, try pumping for about 15 minutes every 2 to 3 hours. Pump both breasts at the same time if you can.   If your infant has a formula feeding, make sure you pump your milk around the same time to maintain your supply.   Begin pumping breast milk a few weeks before you return to work. This will help you develop techniques that work for you and will be able to store extra milk.   Find a  source of breastfeeding information that works well for you.  TIPS FOR STORING BREAST MILK  Store breast milk in a sealable sterile bag, jar, or container provided with your pumping supplies.   Store milk in small amounts close to what your infant is drinking at each feeding.   Cool pumped milk in a refrigerator or cooler. Pumped milk can last at the back of the refrigerator for 3 to 8 days.   Place cooled milk at the back of the freezer for up to 3 months.   Thaw the milk in its container or bag in warm water up to 24 hours in advance. Do not use a microwave to thaw or heat milk. Do not refreeze the milk after it has been thawed.   Breast milk is safe to drink when left at room temperature (mid 70s or colder) for 4 to 8 hours. After that, throw it away.   Milk fat can separate and look funny. The color can vary slightly from day to day. This is normal. Always shake the milk before using it to mix the fat with the more watery portion.  SEEK MEDICAL CARE IF:   You are having trouble pumping or feeding your infant.   You are concerned that you are not making enough milk.   You have nipple pain, soreness, or redness.   You have other questions or concerns related to you or your infant.  Document Released: 04/23/2010 Document Revised: 10/23/2011 Document Reviewed: 04/23/2010 [employer] Patient Information 2012 [employer]. 

## 2012-01-21 NOTE — Discharge Summary (Signed)
Obstetric Discharge Summary Reason for Admission: onset of labor Prenatal Procedures: ultrasound Intrapartum Procedures: spontaneous vaginal delivery Postpartum Procedures: none Complications-Operative and Postpartum: none Hemoglobin  Date Value Range Status  01/19/2012 14.4  12.0-15.0 (g/dL) Final     HCT  Date Value Range Status  01/19/2012 42.0  36.0-46.0 (%) Final    Discharge Diagnoses: Term Pregnancy-delivered  Discharge Information: Date: 01/21/2012 Activity: unrestricted per booklet Diet: routine Medications: ibuprofen and All purpose nipple ointment Condition: stable Instructions: refer to practice specific booklet Discharge to: home Follow-up Information    Follow up with [facility]. Schedule an appointment as soon as possible for a visit in 6 weeks.   Contact information:   [address] [place] [address]        Declines contraception. Reassess at 6 wk PP visit.  Newborn Data: Live born female  Birth Weight: 7 lb 12.5 oz (3530 g) APGAR: 9, 10  Home with mother.  Shelvia Fojtik 01/21/2012, 8:06 AM

## 2012-01-21 NOTE — Progress Notes (Signed)
Post Partum Day#2 NSVD  Subjective: no complaints, up ad lib, voiding and some redness and soreness nipples rt >lt  Objective: Blood pressure 101/63, pulse 81, temperature 98.3 F (36.8 C), temperature source Oral, resp. rate 18, height 5\' 8"  (1.727 m), weight 73.029 kg (161 lb), last menstrual period 04/15/2011, unknown if currently breastfeeding.  Physical Exam:  General: alert, cooperative, appears stated age and no distress Lochia: appropriate Uterine Fundus: firm Incision: n/a DVT Evaluation: No evidence of DVT seen on physical exam. Breasts:sl redness of rt nipple and areola, no bleeding, no masses  Basename 01/19/12 1536  HGB 14.4  HCT 42.0    Assessment/Plan: Discharge home, Breastfeeding and Contraception plans natural FP   LOS: 2 days   Sherry Page 01/21/2012, 7:54 AM

## 2012-01-21 NOTE — H&P (Signed)
Agree with above note.  Sherry Page 01/21/2012 7:50 AM

## 2012-01-22 NOTE — Discharge Summary (Signed)
Agree with above note.  Sherry Page 01/22/2012 8:39 AM

## 2012-01-27 ENCOUNTER — Ambulatory Visit (HOSPITAL_COMMUNITY)
Admit: 2012-01-27 | Discharge: 2012-01-27 | Disposition: A | Discharge status: Home / Self Care | Payer: BC Managed Care – PPO | Attending: Obstetrics & Gynecology | Admitting: Obstetrics & Gynecology

## 2012-01-27 NOTE — Progress Notes (Signed)
Adult Lactation Consultation Outpatient Visit Note  Patient Name: Sherry Page Date of Birth: January 09, 1981 Gestational Age at Delivery: [redacted]w[redacted]d Type of Delivery: Vaginal  Breastfeeding History: Frequency of Breastfeeding: 2-3 hrs. Length of Feeding: 10-20 mins Voids: 8/24 hrs Stools: 3/24 hrs  Sherry Page comes in today due to sore nipples with latch.  She does say that the discomfort is getting less and less, and some feedings there isn't any pain. Both nipples have some slight scabbing on tip.  Using olive oil rather than lanolin, as she has a history of yeast with 1st child.  Baby is gaining very well: birth weight 7-12, discharge weight- 7-6, and 8 day old weight 8-3.2!  Sherry Page very happy.  Watched Sherry Page latch using a boppy pillow, baby's body facing up, and head turned.  Sherry Page moving her breast to latch baby.  Showed Sherry Page how to support baby more horizontally, using a stack of baby blankets and having baby turned more tummy to tummy.  Recommended moving baby to where her nipple is rather than moving her breast.  Assisted Sherry Page in holding baby's lower part of her head and controlling the latch more.  This latch was more comfortable.  Nipples noted to be slightly pinched on outer corner of nipples, so recommended Sherry Page not move her breast, but reposition baby.  Expressed breast milk to nipple, comfort gels, probiotic supplement, limit sweets, and dairy products from diet.    Nipple trauma healing, and soreness improving.  Some changed to baby's latch given, but things are improving.  Baby fed on both breasts, and then the first one again.  Encouraged Hospital doctor to indulge baby by feeding her whenever she show the cues, rather than looking at the clock.  Baby took 90 ml / 3 ounces today in office.  Sherry Page to call our office prn    Sherry Page 01/27/2012, 9:31 AM

## 2012-03-01 ENCOUNTER — Encounter: Payer: Self-pay | Admitting: Advanced Practice Midwife

## 2012-03-01 ENCOUNTER — Ambulatory Visit (INDEPENDENT_AMBULATORY_CARE_PROVIDER_SITE_OTHER): Payer: BC Managed Care – PPO | Admitting: Advanced Practice Midwife

## 2012-03-01 VITALS — BP 88/56 | HR 80 | Temp 98.5°F | Resp 16 | Ht 65.0 in | Wt 138.0 lb

## 2012-03-01 DIAGNOSIS — O9229 Other disorders of breast associated with pregnancy and the puerperium: Secondary | ICD-10-CM

## 2012-03-01 MED ORDER — NYSTATIN-TRIAMCINOLONE 100000-0.1 UNIT/GM-% EX CREA: TOPICAL_CREAM | Freq: Four times a day (QID) | CUTANEOUS | Status: DC

## 2012-03-01 NOTE — Progress Notes (Signed)
  Subjective:     Sherry Page is a 31 y.o. female who presents for a postpartum visit. She is 6 weeks postpartum following a spontaneous vaginal delivery. I have fully reviewed the prenatal and intrapartum course. The delivery was at term. Outcome: spontaneous vaginal delivery. Anesthesia: none. Postpartum course has been uneventful. Baby's course has been uneventful. Baby is feeding by breast. Bleeding no bleeding. Bowel function is normal. Bladder function is normal. Patient is sexually active. Contraception method is none. Postpartum depression screening: negative.  Has been having yeast on nipples. Has been taking Diflucan all week with no improvement.   The following portions of the patient's history were reviewed and updated as appropriate: allergies, current medications, past family history, past medical history, past social history, past surgical history and problem list.  Review of Systems Pertinent items are noted in HPI.   Objective:    BP 88/56  Pulse 80  Temp(Src) 98.5 F (36.9 C) (Oral)  Resp 16  Ht 5\' 5"  (1.651 m)  Wt 138 lb (62.596 kg)  BMI 22.96 kg/m2  Breastfeeding? Yes  General:  alert   Breasts:  inspection negative, no nipple discharge or bleeding, no masses or nodularity palpable and erethema and rash on areola  Lungs: clear to auscultation bilaterally  Heart:  regular rate and rhythm, S1, S2 normal, no murmur, click, rub or gallop  Abdomen: soft, non-tender; bowel sounds normal; no masses,  no organomegaly   Vulva:  normal  Vagina: normal vagina  Cervix:  multiparous appearance  Corpus: normal  Adnexa:  normal adnexa  Rectal Exam: Not performed.        Assessment:    Normal postpartum exam. Pap smear not done at today's visit.   Plan:    1. Contraception: none 2. Rx Mycolog cream for nipples to alternate with tea bags 3. Follow up in: 3 months for pap or as needed.

## 2012-03-01 NOTE — Patient Instructions (Signed)

## 2012-03-30 ENCOUNTER — Encounter: Payer: Self-pay | Admitting: Family Medicine

## 2012-03-30 ENCOUNTER — Ambulatory Visit (INDEPENDENT_AMBULATORY_CARE_PROVIDER_SITE_OTHER): Payer: BC Managed Care – PPO | Admitting: Family Medicine

## 2012-03-30 VITALS — BP 99/63 | HR 78 | Temp 98.2°F | Ht 68.0 in | Wt 135.0 lb

## 2012-03-30 DIAGNOSIS — H109 Unspecified conjunctivitis: Secondary | ICD-10-CM

## 2012-03-30 MED ORDER — GENTAMICIN SULFATE 0.3 % OP SOLN: 2.0000 [drp] | Freq: Four times a day (QID) | OPHTHALMIC | Status: AC

## 2012-03-30 NOTE — Progress Notes (Signed)
  Subjective:    Patient ID: Sherry Page, female    DOB: 05-24-1981, 31 y.o.   MRN: [id number]  Conjunctivitis  The current episode started 5 to 7 days ago. The onset was sudden. The problem occurs continuously. The problem has been gradually worsening. The problem is moderate. The symptoms are relieved by nothing. Associated symptoms include eye itching, congestion, rhinorrhea, sore throat, URI, eye pain and eye redness. Pertinent negatives include no fever, no decreased vision, no double vision, no photophobia, no abdominal pain, no constipation, no diarrhea, no nausea, no vomiting, no ear discharge, no ear pain, no headaches, no hearing loss, no mouth sores, no swollen glands, no muscle aches, no neck pain, no neck stiffness, no cough, no wheezing, no rash and no eye discharge.      Review of Systems  Constitutional: Positive for fatigue. Negative for fever.  HENT: Positive for congestion, sore throat and rhinorrhea. Negative for hearing loss, ear pain, mouth sores, neck pain and ear discharge.   Eyes: Positive for pain, redness and itching. Negative for double vision, photophobia and discharge.  Respiratory: Negative for cough and wheezing.   Gastrointestinal: Negative for nausea, vomiting, abdominal pain, diarrhea and constipation.  Skin: Negative for rash.  Neurological: Negative for headaches.        BP 99/63  Pulse 78  Temp(Src) 98.2 F (36.8 C) (Oral)  Ht 5\' 8"  (1.727 m)  Wt 135 lb (61.236 kg)  BMI 20.53 kg/m2  SpO2 100%  Breastfeeding? Yes with the one from the low as freshest jams Objective:   Physical Exam  HENT:  Head: Normocephalic.  Right Ear: Tympanic membrane is not scarred and not erythematous.  Left Ear: Tympanic membrane is scarred. Tympanic membrane is not erythematous.  Mouth/Throat: Normal dentition. No dental caries. No oropharyngeal exudate or posterior oropharyngeal edema.  Eyes: Pupils are equal, round, and reactive to light. No foreign body present in  the right eye. Right conjunctiva is injected. Right conjunctiva has no hemorrhage.  Neck: Neck supple.      Assessment & Plan:  Conjunctivitis. No signs of systemic illness. Discuss with patient who is nursing no contraindication to most of the eyedrops but these have not been studied. We'll place on gentamicin eyedrops 2 drops to affected eye 4 times a day for the next 5-7 days. Please contact me if not better in 48 hours

## 2012-03-30 NOTE — Patient Instructions (Signed)
Conjunctivitis Conjunctivitis is commonly called "pink eye." Conjunctivitis can be caused by bacterial or viral infection, allergies, or injuries. There is usually redness of the lining of the eye, itching, discomfort, and sometimes discharge. There may be deposits of matter along the eyelids. A viral infection usually causes a watery discharge, while a bacterial infection causes a yellowish, thick discharge. Pink eye is very contagious and spreads by direct contact. You may be given antibiotic eyedrops as part of your treatment. Before using your eye medicine, remove all drainage from the eye by washing gently with warm water and cotton balls. Continue to use the medication until you have awakened 2 mornings in a row without discharge from the eye. Do not rub your eye. This increases the irritation and helps spread infection. Use separate towels from other household members. Wash your hands with soap and water before and after touching your eyes. Use cold compresses to reduce pain and sunglasses to relieve irritation from light. Do not wear contact lenses or wear eye makeup until the infection is gone. SEEK MEDICAL CARE IF:   Your symptoms are not better after 3 days of treatment.   You have increased pain or trouble seeing.   The outer eyelids become very red or swollen.  Document Released: 12/11/2004 Document Revised: 10/23/2011 Document Reviewed: 11/03/2005 [employer] Patient Information 2012 [employer]. 

## 2012-05-26 ENCOUNTER — Ambulatory Visit (INDEPENDENT_AMBULATORY_CARE_PROVIDER_SITE_OTHER): Payer: BC Managed Care – PPO | Admitting: Obstetrics & Gynecology

## 2012-05-26 ENCOUNTER — Encounter: Payer: Self-pay | Admitting: Obstetrics & Gynecology

## 2012-05-26 VITALS — BP 113/66 | HR 87 | Temp 98.5°F | Resp 16 | Wt 133.0 lb

## 2012-05-26 DIAGNOSIS — Z01419 Encounter for gynecological examination (general) (routine) without abnormal findings: Secondary | ICD-10-CM

## 2012-05-26 DIAGNOSIS — Z1151 Encounter for screening for human papillomavirus (HPV): Secondary | ICD-10-CM

## 2012-05-26 DIAGNOSIS — Z124 Encounter for screening for malignant neoplasm of cervix: Secondary | ICD-10-CM

## 2012-05-26 DIAGNOSIS — Z3009 Encounter for other general counseling and advice on contraception: Secondary | ICD-10-CM

## 2012-05-26 NOTE — Patient Instructions (Signed)
Preventive Care for Adults, Female A healthy lifestyle and preventive care can promote health and wellness. Preventive health guidelines for women include the following key practices.  A routine yearly physical is a good way to check with your caregiver about your health and preventive screening. It is a chance to share any concerns and updates on your health, and to receive a thorough exam.   Visit your dentist for a routine exam and preventive care every 6 months. Brush your teeth twice a day and floss once a day. Good oral hygiene prevents tooth decay and gum disease.   The frequency of eye exams is based on your age, health, family medical history, use of contact lenses, and other factors. Follow your caregiver's recommendations for frequency of eye exams.   Eat a healthy diet. Foods like vegetables, fruits, whole grains, low-fat dairy products, and lean protein foods contain the nutrients you need without too many calories. Decrease your intake of foods high in solid fats, added sugars, and salt. Eat the right amount of calories for you.Get information about a proper diet from your caregiver, if necessary.   Regular physical exercise is one of the most important things you can do for your health. Most adults should get at least 150 minutes of moderate-intensity exercise (any activity that increases your heart rate and causes you to sweat) each week. In addition, most adults need muscle-strengthening exercises on 2 or more days a week.   Maintain a healthy weight. The body mass index (BMI) is a screening tool to identify possible weight problems. It provides an estimate of body fat based on height and weight. Your caregiver can help determine your BMI, and can help you achieve or maintain a healthy weight.For adults 20 years and older:   A BMI below 18.5 is considered underweight.   A BMI of 18.5 to 24.9 is normal.   A BMI of 25 to 29.9 is considered overweight.   A BMI of 30 and above is  considered obese.   Maintain normal blood lipids and cholesterol levels by exercising and minimizing your intake of saturated fat. Eat a balanced diet with plenty of fruit and vegetables. Blood tests for lipids and cholesterol should begin at age 20 and be repeated every 5 years. If your lipid or cholesterol levels are high, you are over 50, or you are at high risk for heart disease, you may need your cholesterol levels checked more frequently.Ongoing high lipid and cholesterol levels should be treated with medicines if diet and exercise are not effective.   If you smoke, find out from your caregiver how to quit. If you do not use tobacco, do not start.   If you are pregnant, do not drink alcohol. If you are breastfeeding, be very cautious about drinking alcohol. If you are not pregnant and choose to drink alcohol, do not exceed 1 drink per day. One drink is considered to be 12 ounces (355 mL) of beer, 5 ounces (148 mL) of wine, or 1.5 ounces (44 mL) of liquor.   Avoid use of street drugs. Do not share needles with anyone. Ask for help if you need support or instructions about stopping the use of drugs.   High blood pressure causes heart disease and increases the risk of stroke. Your blood pressure should be checked at least every 1 to 2 years. Ongoing high blood pressure should be treated with medicines if weight loss and exercise are not effective.   If you are 55 to 33   years old, ask your caregiver if you should take aspirin to prevent strokes.   Diabetes screening involves taking a blood sample to check your fasting blood sugar level. This should be done once every 3 years, after age 45, if you are within normal weight and without risk factors for diabetes. Testing should be considered at a younger age or be carried out more frequently if you are overweight and have at least 1 risk factor for diabetes.   Breast cancer screening is essential preventive care for women. You should practice "breast  self-awareness." This means understanding the normal appearance and feel of your breasts and may include breast self-examination. Any changes detected, no matter how small, should be reported to a caregiver. Women in their 20s and 30s should have a clinical breast exam (CBE) by a caregiver as part of a regular health exam every 1 to 3 years. After age 40, women should have a CBE every year. Starting at age 40, women should consider having a mammography (breast X-ray test) every year. Women who have a family history of breast cancer should talk to their caregiver about genetic screening. Women at a high risk of breast cancer should talk to their caregivers about having magnetic resonance imaging (MRI) and a mammography every year.   The Pap test is a screening test for cervical cancer. A Pap test can show cell changes on the cervix that might become cervical cancer if left untreated. A Pap test is a procedure in which cells are obtained and examined from the lower end of the uterus (cervix).   Women should have a Pap test starting at age 21.   Between ages 21 and 29, Pap tests should be repeated every 2 years.   Beginning at age 30, you should have a Pap test every 3 years as long as the past 3 Pap tests have been normal.   Some women have medical problems that increase the chance of getting cervical cancer. Talk to your caregiver about these problems. It is especially important to talk to your caregiver if a new problem develops soon after your last Pap test. In these cases, your caregiver may recommend more frequent screening and Pap tests.   The above recommendations are the same for women who have or have not gotten the vaccine for human papillomavirus (HPV).   If you had a hysterectomy for a problem that was not cancer or a condition that could lead to cancer, then you no longer need Pap tests. Even if you no longer need a Pap test, a regular exam is a good idea to make sure no other problems are  starting.   If you are between ages 65 and 70, and you have had normal Pap tests going back 10 years, you no longer need Pap tests. Even if you no longer need a Pap test, a regular exam is a good idea to make sure no other problems are starting.   If you have had past treatment for cervical cancer or a condition that could lead to cancer, you need Pap tests and screening for cancer for at least 20 years after your treatment.   If Pap tests have been discontinued, risk factors (such as a new sexual partner) need to be reassessed to determine if screening should be resumed.   The HPV test is an additional test that may be used for cervical cancer screening. The HPV test looks for the virus that can cause the cell changes on the cervix.   The cells collected during the Pap test can be tested for HPV. The HPV test could be used to screen women aged 30 years and older, and should be used in women of any age who have unclear Pap test results. After the age of 30, women should have HPV testing at the same frequency as a Pap test.   Colorectal cancer can be detected and often prevented. Most routine colorectal cancer screening begins at the age of 50 and continues through age 75. However, your caregiver may recommend screening at an earlier age if you have risk factors for colon cancer. On a yearly basis, your caregiver may provide home test kits to check for hidden blood in the stool. Use of a small camera at the end of a tube, to directly examine the colon (sigmoidoscopy or colonoscopy), can detect the earliest forms of colorectal cancer. Talk to your caregiver about this at age 50, when routine screening begins. Direct examination of the colon should be repeated every 5 to 10 years through age 75, unless early forms of pre-cancerous polyps or small growths are found.   Hepatitis C blood testing is recommended for all people born from 1945 through 1965 and any individual with known risks for hepatitis C.    Practice safe sex. Use condoms and avoid high-risk sexual practices to reduce the spread of sexually transmitted infections (STIs). STIs include gonorrhea, chlamydia, syphilis, trichomonas, herpes, HPV, and human immunodeficiency virus (HIV). Herpes, HIV, and HPV are viral illnesses that have no cure. They can result in disability, cancer, and death. Sexually active women aged 25 and younger should be checked for chlamydia. Older women with new or multiple partners should also be tested for chlamydia. Testing for other STIs is recommended if you are sexually active and at increased risk.   Osteoporosis is a disease in which the bones lose minerals and strength with aging. This can result in serious bone fractures. The risk of osteoporosis can be identified using a bone density scan. Women ages 65 and over and women at risk for fractures or osteoporosis should discuss screening with their caregivers. Ask your caregiver whether you should take a calcium supplement or vitamin D to reduce the rate of osteoporosis.   Menopause can be associated with physical symptoms and risks. Hormone replacement therapy is available to decrease symptoms and risks. You should talk to your caregiver about whether hormone replacement therapy is right for you.   Use sunscreen with sun protection factor (SPF) of 30 or more. Apply sunscreen liberally and repeatedly throughout the day. You should seek shade when your shadow is shorter than you. Protect yourself by wearing long sleeves, pants, a wide-brimmed hat, and sunglasses year round, whenever you are outdoors.   Once a month, do a whole body skin exam, using a mirror to look at the skin on your back. Notify your caregiver of new moles, moles that have irregular borders, moles that are larger than a pencil eraser, or moles that have changed in shape or color.   Stay current with required immunizations.   Influenza. You need a dose every fall (or winter). The composition of  the flu vaccine changes each year, so being vaccinated once is not enough.   Pneumococcal polysaccharide. You need 1 to 2 doses if you smoke cigarettes or if you have certain chronic medical conditions. You need 1 dose at age 65 (or older) if you have never been vaccinated.   Tetanus, diphtheria, pertussis (Tdap, Td). Get 1 dose of   Tdap vaccine if you are younger than age 65, are over 65 and have contact with an infant, are a healthcare worker, are pregnant, or simply want to be protected from whooping cough. After that, you need a Td booster dose every 10 years. Consult your caregiver if you have not had at least 3 tetanus and diphtheria-containing shots sometime in your life or have a deep or dirty wound.   HPV. You need this vaccine if you are a woman age 26 or younger. The vaccine is given in 3 doses over 6 months.   Measles, mumps, rubella (MMR). You need at least 1 dose of MMR if you were born in 1957 or later. You may also need a second dose.   Meningococcal. If you are age 19 to 21 and a first-year college student living in a residence hall, or have one of several medical conditions, you need to get vaccinated against meningococcal disease. You may also need additional booster doses.   Zoster (shingles). If you are age 60 or older, you should get this vaccine.   Varicella (chickenpox). If you have never had chickenpox or you were vaccinated but received only 1 dose, talk to your caregiver to find out if you need this vaccine.   Hepatitis A. You need this vaccine if you have a specific risk factor for hepatitis A virus infection or you simply wish to be protected from this disease. The vaccine is usually given as 2 doses, 6 to 18 months apart.   Hepatitis B. You need this vaccine if you have a specific risk factor for hepatitis B virus infection or you simply wish to be protected from this disease. The vaccine is given in 3 doses, usually over 6 months.  Preventive Services /  Frequency Ages 19 to 39  Blood pressure check.** / Every 1 to 2 years.   Lipid and cholesterol check.** / Every 5 years beginning at age 20.   Clinical breast exam.** / Every 3 years for women in their 20s and 30s.   Pap test.** / Every 2 years from ages 21 through 29. Every 3 years starting at age 30 through age 65 or 70 with a history of 3 consecutive normal Pap tests.   HPV screening.** / Every 3 years from ages 30 through ages 65 to 70 with a history of 3 consecutive normal Pap tests.   Hepatitis C blood test.** / For any individual with known risks for hepatitis C.   Skin self-exam. / Monthly.   Influenza immunization.** / Every year.   Pneumococcal polysaccharide immunization.** / 1 to 2 doses if you smoke cigarettes or if you have certain chronic medical conditions.   Tetanus, diphtheria, pertussis (Tdap, Td) immunization. / A one-time dose of Tdap vaccine. After that, you need a Td booster dose every 10 years.   HPV immunization. / 3 doses over 6 months, if you are 26 and younger.   Measles, mumps, rubella (MMR) immunization. / You need at least 1 dose of MMR if you were born in 1957 or later. You may also need a second dose.   Meningococcal immunization. / 1 dose if you are age 19 to 21 and a first-year college student living in a residence hall, or have one of several medical conditions, you need to get vaccinated against meningococcal disease. You may also need additional booster doses.   Varicella immunization.** / Consult your caregiver.   Hepatitis A immunization.** / Consult your caregiver. 2 doses, 6 to 18 months   apart.   Hepatitis B immunization.** / Consult your caregiver. 3 doses usually over 6 months.    ** Family history and personal history of risk and conditions may change your caregiver's recommendations. Document Released: 12/30/2001 Document Revised: 10/23/2011 Document Reviewed: 03/31/2011 [employer] Patient Information 2012 [employer]. 

## 2012-05-26 NOTE — Progress Notes (Signed)
  Subjective:     Sherry Page is a 31 y.o. [phone] female and is here for a comprehensive physical exam. Had SVD 4 months agp, baby girl is doing well, patient is breastfeeding. Also has 24 year old daughter.  The patient reports no problems. Uses lactational amenorrhea for birth control, knows this is not very effective except in rare cases of exclusive intensive breastfeeding.  No GYN concerns.  History   Social History  . Marital Status: Married    Spouse Name: Trey Paula    Number of Children: 1  . Years of Education: N/A   Occupational History  .     Social History Main Topics  . Smoking status: Former Smoker -- 0.2 packs/day for 10 years    Types: Cigarettes  . Smokeless tobacco: Never Used  . Alcohol Use: No  . Drug Use: No  . Sexually Active: Yes     Pt is currnetly pregnant   Other Topics Concern  . Not on file   Social History Narrative  . No narrative on file   Health Maintenance  Topic Date Due  . Tetanus/tdap  07/31/1998  . Influenza Vaccine  08/17/2012  . Pap Smear  06/12/2014   The following portions of the patient's history were reviewed and updated as appropriate: allergies, current medications, past family history, past medical history, past social history, past surgical history and problem list.  Review of Systems A comprehensive review of systems was negative.   Objective:   Blood pressure 113/66, pulse 87, temperature 98.5 F (36.9 C), temperature source Oral, resp. rate 16, weight 133 lb (60.328 kg), currently breastfeeding. GENERAL: Well-developed, well-nourished female in no acute distress.  HEENT: Normocephalic, atraumatic. Sclerae anicteric.  NECK: Supple. Normal thyroid.  LUNGS: Clear to auscultation bilaterally.  HEART: Regular rate and rhythm. BREASTS: Symmetric with everted nipples. No masses, skin changes, nipple drainage, or lymphadenopathy. ABDOMEN: Soft, nontender, nondistended. No organomegaly. PELVIC: Normal external female genitalia.  Vagina is pink and rugated.  Normal discharge. Normal cervix contour. Pap smear obtained. Uterus is normal in size. No adnexal mass or tenderness.  EXTREMITIES: No cyanosis, clubbing, or edema, 2+ distal pulses.   Assessment:    Healthy female exam.      Plan:   Pap done, follow up results and manage accordingly. Information about nonhormonal contraception given to patient. She is interested in Paragard; will let us know if she decides to get this. Routine preventative health maintenance measures emphasized

## 2012-11-22 ENCOUNTER — Encounter: Payer: Self-pay | Admitting: Physician Assistant

## 2012-11-22 ENCOUNTER — Ambulatory Visit (INDEPENDENT_AMBULATORY_CARE_PROVIDER_SITE_OTHER): Payer: BC Managed Care – PPO | Admitting: Physician Assistant

## 2012-11-22 VITALS — BP 95/65 | HR 83 | Temp 98.1°F | Resp 16 | Wt 126.0 lb

## 2012-11-22 DIAGNOSIS — J329 Chronic sinusitis, unspecified: Secondary | ICD-10-CM

## 2012-11-22 MED ORDER — AMOXICILLIN-POT CLAVULANATE 875-125 MG PO TABS: 1.0000 | ORAL_TABLET | Freq: Two times a day (BID) | ORAL | Status: DC

## 2012-11-22 NOTE — Progress Notes (Signed)
  Subjective:    Patient ID: Sherry Page, female    DOB: 12-Jan-1981, 32 y.o.   MRN: [id number]  Sinusitis This is a new problem. The current episode started 1 to 4 weeks ago. The problem has been gradually worsening since onset. There has been no fever. Her pain is at a severity of 7/10. The pain is moderate. Associated symptoms include congestion, coughing, headaches, sinus pressure and a sore throat. Pertinent negatives include no chills, diaphoresis, ear pain, hoarse voice, neck pain, shortness of breath, sneezing or swollen glands. (Teeth hurt) Past treatments include oral decongestants and saline sprays (Mucinex D, sinus rinses). The treatment provided mild relief.       Review of Systems  Constitutional: Negative for chills and diaphoresis.  HENT: Positive for congestion, sore throat and sinus pressure. Negative for ear pain, hoarse voice, sneezing and neck pain.   Respiratory: Positive for cough. Negative for shortness of breath.   Neurological: Positive for headaches.       Objective:   Physical Exam  Constitutional: She is oriented to person, place, and time. She appears well-developed and well-nourished.  HENT:  Head: Normocephalic and atraumatic.  Right Ear: External ear normal.  Left Ear: External ear normal.       TMs clear bilaterally.  Oropharynx erythematous with post nasal drip.  Bilateral nares with swollen turbinates and rhinorrhea.  Maxillary sinus tenderness with palpation bilaterally.  Eyes: Conjunctivae normal are normal.  Neck: Normal range of motion. Neck supple.  Cardiovascular: Normal rate, regular rhythm and normal heart sounds.   Pulmonary/Chest: Effort normal and breath sounds normal. She has no wheezes.  Lymphadenopathy:    She has no cervical adenopathy.  Neurological: She is alert and oriented to person, place, and time.  Skin: Skin is warm and dry.  Psychiatric: She has a normal mood and affect. Her behavior is normal.            Assessment & Plan:  Sinusitis- patient treated with Augmentin for 10 days. Symptomatic care discussed and handout given. Patient encouraged to use sinus rinses and continue with Mucinex. Followup if not improving. Did offer medication for cough and patient declined at this time.

## 2012-11-22 NOTE — Patient Instructions (Addendum)

## 2013-01-18 ENCOUNTER — Telehealth: Payer: Self-pay | Admitting: *Deleted

## 2013-01-18 NOTE — Telephone Encounter (Signed)
Pt called with concerns that she has decreased her breast feeding since her daughter turned 1 today and she has not had a period.  She is not using any contraception but has done several pregnancy test which were neg.  Pt is still breast feeding BID and pumping once daily.  Explained that once she has stopped breast feeding completely it may take 4-6 weeks to restart her periods.  She is due for her annual and told her that if she hasn't had a period within the next month, we would do further testing to see why she was amenorrhea.

## 2013-05-05 ENCOUNTER — Emergency Department
Admission: EM | Admit: 2013-05-05 | Discharge: 2013-05-05 | Disposition: A | Discharge status: Home / Self Care | Payer: BC Managed Care – PPO | Source: Home / Self Care | Attending: Emergency Medicine | Admitting: Emergency Medicine

## 2013-05-05 ENCOUNTER — Encounter: Payer: Self-pay | Admitting: Emergency Medicine

## 2013-05-05 DIAGNOSIS — J029 Acute pharyngitis, unspecified: Secondary | ICD-10-CM

## 2013-05-05 DIAGNOSIS — J069 Acute upper respiratory infection, unspecified: Secondary | ICD-10-CM

## 2013-05-05 LAB — POCT RAPID STREP A (OFFICE): Rapid Strep A Screen: NEGATIVE

## 2013-05-05 MED ORDER — AMOXICILLIN 875 MG PO TABS: 875.0000 mg | ORAL_TABLET | Freq: Two times a day (BID) | ORAL | Status: DC

## 2013-05-05 NOTE — ED Provider Notes (Signed)
History     CSN: [id number]  Arrival date & time 05/05/13  1749   First MD Initiated Contact with Patient 05/05/13 1750      Chief Complaint  Patient presents with  . Sore Throat  . Hoarse  . Otalgia    (Consider location/radiation/quality/duration/timing/severity/associated sxs/prior treatment) HPI Sherry Page is a 32 y.o. female who complains of onset of cold symptoms for ___ days.  The symptoms are constant and mild-moderate in severity. + L sided sore throat + hoarseness +/- cough No pleuritic pain No wheezing + nasal congestion + post-nasal drainage + sinus pain/pressure No chest congestion No itchy/red eyes + L earache No hemoptysis No SOB No chills/sweats No fever No nausea No vomiting No abdominal pain No diarrhea No skin rashes No fatigue No myalgias No headache    Past Medical History  Diagnosis Date  . Gestational diabetes 2010    Glucometer testing 4x daily  . 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 Anal fissure 2010    after last pregnancy  . Urinary tract infection   . Hemorrhoids     Past Surgical History  Procedure Laterality Date  . Induced abortion      Family History  Problem Relation Age of Onset  . Heart failure Father     Alive  . Heart disease Father   . Stroke Maternal Grandfather     deceased  . Heart attack Maternal Grandmother     alive  . Heart disease Maternal Grandmother   . Anemia Sister     alive  . Diabetes Paternal Grandmother   . Depression Paternal Grandmother   . Anesthesia problems Neg Hx     History  Substance Use Topics  . Smoking status: Former Smoker -- 0.25 packs/day for 10 years    Types: Cigarettes  . Smokeless tobacco: Never Used  . Alcohol Use: No    OB History   Grav Para Term Preterm Abortions TAB SAB Ect Mult Living   3 2 2  0 1 1 0 0 0 2      Review of Systems  All other systems reviewed and are negative.    Allergies  Review of patient's allergies indicates no known  allergies.  Home Medications   Current Outpatient Rx  Name  Route  Sig  Dispense  Refill  . amoxicillin (AMOXIL) 875 MG tablet   Oral   Take 1 tablet (875 mg total) by mouth 2 (two) times daily.   14 tablet   0   . amoxicillin-clavulanate (AUGMENTIN) 875-125 MG per tablet   Oral   Take 1 tablet by mouth 2 (two) times daily. For 10 days.   20 tablet   0   . docusate sodium (COLACE) 100 MG capsule   Oral   Take 100 mg by mouth at bedtime.         . Prenatal Vit-Fe Fumarate-FA (PRENATAL MULTIVITAMIN) TABS   Oral   Take 1 tablet by mouth daily.   30 tablet   1     BP 107/66  Pulse 74  Temp(Src) 98.1 F (36.7 C) (Oral)  Resp 16  Ht 5\' 8"  (1.727 m)  Wt 130 lb (58.968 kg)  BMI 19.77 kg/m2  SpO2 99%  LMP 04/10/2013  Physical Exam  Nursing note and vitals reviewed. Constitutional: She is oriented to person, place, and time. She appears well-developed and well-nourished.  HENT:  Head: Normocephalic and atraumatic.  Right Ear: Tympanic membrane, external ear  and ear canal normal.  Left Ear: Tympanic membrane, external ear and ear canal normal.  Nose: Mucosal edema and rhinorrhea present.  Mouth/Throat: Posterior oropharyngeal erythema present. No oropharyngeal exudate or posterior oropharyngeal edema.  Eyes: No scleral icterus.  Neck: Neck supple.  Cardiovascular: Regular rhythm and normal heart sounds.   Pulmonary/Chest: Effort normal and breath sounds normal. No respiratory distress.  Lymphadenopathy:       Head (right side): Tonsillar adenopathy present.       Head (left side): Tonsillar adenopathy present.  Neurological: She is alert and oriented to person, place, and time.  Skin: Skin is warm and dry.  Psychiatric: She has a normal mood and affect. Her speech is normal.    ED Course  Procedures (including critical care time)  Labs Reviewed  STREP A DNA PROBE  POCT RAPID STREP A (OFFICE)   No results found.   1. Acute pharyngitis       MDM  1)   Take the prescribed antibiotic as instructed.  Rapid strep neg.  Culture pending.  DDx includes L mild tonsillitis vs L maxillary sinusitis. 2)  Use nasal saline solution (over the counter) at least 3 times a day. 3)  Use over the counter decongestants like Zyrtec-D every 12 hours as needed to help with congestion.  If you have hypertension, do not take medicines with sudafed.  4)  Can take tylenol every 6 hours or motrin every 8 hours for pain or fever. 5)  Follow up with your primary doctor if no improvement in 5-7 days, sooner if increasing pain, fever, or new symptoms.     Marlaine Hind, MD 05/05/13 1818

## 2013-05-05 NOTE — ED Notes (Signed)
Reports 5 day history of sore throat, hoarseness, cough, congestion and headache. No OTC today.

## 2013-05-06 LAB — STREP A DNA PROBE: GASP: NEGATIVE

## 2013-05-07 ENCOUNTER — Telehealth: Payer: Self-pay

## 2013-05-07 NOTE — ED Notes (Signed)
Left a message on voice mail asking how patient is feeling and advising to call back with any questions or concerns.  

## 2013-06-22 ENCOUNTER — Ambulatory Visit (INDEPENDENT_AMBULATORY_CARE_PROVIDER_SITE_OTHER): Payer: BC Managed Care – PPO | Admitting: Obstetrics & Gynecology

## 2013-06-22 ENCOUNTER — Encounter: Payer: Self-pay | Admitting: Obstetrics & Gynecology

## 2013-06-22 VITALS — BP 101/60 | HR 86 | Resp 16 | Ht 67.0 in | Wt 118.0 lb

## 2013-06-22 DIAGNOSIS — Z124 Encounter for screening for malignant neoplasm of cervix: Secondary | ICD-10-CM

## 2013-06-22 DIAGNOSIS — Z01419 Encounter for gynecological examination (general) (routine) without abnormal findings: Secondary | ICD-10-CM

## 2013-06-22 DIAGNOSIS — Z0000 Encounter for general adult medical examination without abnormal findings: Secondary | ICD-10-CM

## 2013-06-22 DIAGNOSIS — Z1151 Encounter for screening for human papillomavirus (HPV): Secondary | ICD-10-CM

## 2013-06-22 NOTE — Progress Notes (Signed)
Subjective:    Sherry Page is a 32 y.o. female who presents for an annual exam. The patient has no complaints today. She still has constipation despite daily colace. She had a flex sig about 3 years ago. The patient is sexually active. GYN screening history: last pap: was normal. The patient wears seatbelts: yes. The patient participates in regular exercise: yes. Has the patient ever been transfused or tattooed?: yes. (tattoo) The patient reports that there is not domestic violence in her life.   Menstrual History: OB History   Grav Para Term Preterm Abortions TAB SAB Ect Mult Living   3 2 2  0 1 1 0 0 0 2      Menarche age: 21 Coitarche: 18  Patient's last menstrual period was 06/08/2013.    The following portions of the patient's history were reviewed and updated as appropriate: allergies, current medications, past family history, past medical history, past social history, past surgical history and problem list.  Review of Systems A comprehensive review of systems was negative.  Married for 6 years, denies dyspareunia, homemaker, uses withdrawal for birth control. Very regular period. She has some GSUI but is not ready to have a surgery.   Objective:    BP 101/60  Pulse 86  Resp 16  Ht 5\' 7"  (1.702 m)  Wt 118 lb (53.524 kg)  BMI 18.48 kg/m2  LMP 06/08/2013  General Appearance:    Alert, cooperative, no distress, appears stated age  Head:    Normocephalic, without obvious abnormality, atraumatic  Eyes:    PERRL, conjunctiva/corneas clear, EOM's intact, fundi    benign, both eyes  Ears:    Normal TM's and external ear canals, both ears  Nose:   Nares normal, septum midline, mucosa normal, no drainage    or sinus tenderness  Throat:   Lips, mucosa, and tongue normal; teeth and gums normal  Neck:   Supple, symmetrical, trachea midline, no adenopathy;    thyroid:  no enlargement/tenderness/nodules; no carotid   bruit or JVD  Back:     Symmetric, no curvature, ROM normal, no CVA  tenderness  Lungs:     Clear to auscultation bilaterally, respirations unlabored  Chest Wall:    No tenderness or deformity   Heart:    Regular rate and rhythm, S1 and S2 normal, no murmur, rub   or gallop  Breast Exam:    No tenderness, masses, or nipple abnormality  Abdomen:     Soft, non-tender, bowel sounds active all four quadrants,    no masses, no organomegaly  Genitalia:    Normal female without lesion, discharge or tenderness, NSSA, NT, mobile, normal adnexal exam     Extremities:   Extremities normal, atraumatic, no cyanosis or edema  Pulses:   2+ and symmetric all extremities  Skin:   Skin color, texture, turgor normal, no rashes or lesions  Lymph nodes:   Cervical, supraclavicular, and axillary nodes normal  Neurologic:   CNII-XII intact, normal strength, sensation and reflexes    throughout  .    Assessment:    Healthy female exam.    Plan:     Breast self exam technique reviewed and patient encouraged to perform self-exam monthly. Thin prep Pap smear.  Fasting labs at her confvenience

## 2013-06-28 ENCOUNTER — Other Ambulatory Visit (INDEPENDENT_AMBULATORY_CARE_PROVIDER_SITE_OTHER): Payer: BC Managed Care – PPO

## 2013-06-28 DIAGNOSIS — Z01419 Encounter for gynecological examination (general) (routine) without abnormal findings: Secondary | ICD-10-CM

## 2013-06-29 ENCOUNTER — Telehealth: Payer: Self-pay | Admitting: *Deleted

## 2013-06-29 LAB — COMPREHENSIVE METABOLIC PANEL
ALT: 8 U/L (ref 0–35)
Albumin: 4.2 g/dL (ref 3.5–5.2)
CO2: 27 mEq/L (ref 19–32)
Calcium: 9.1 mg/dL (ref 8.4–10.5)
Chloride: 106 mEq/L (ref 96–112)
Glucose, Bld: 85 mg/dL (ref 70–99)
Sodium: 140 mEq/L (ref 135–145)
Total Bilirubin: 0.6 mg/dL (ref 0.3–1.2)
Total Protein: 6.5 g/dL (ref 6.0–8.3)

## 2013-06-29 LAB — TSH: TSH: 2.131 u[IU]/mL (ref 0.350–4.500)

## 2013-06-29 LAB — LIPID PANEL: Cholesterol: 129 mg/dL (ref 0–200)

## 2013-06-29 NOTE — Telephone Encounter (Signed)
Copy of normal labs mailed to pt's home address. 

## 2013-07-13 ENCOUNTER — Ambulatory Visit: Payer: BC Managed Care – PPO | Admitting: Obstetrics & Gynecology

## 2013-09-17 DIAGNOSIS — O009 Unspecified ectopic pregnancy without intrauterine pregnancy: Secondary | ICD-10-CM

## 2013-09-17 HISTORY — DX: Unspecified ectopic pregnancy without intrauterine pregnancy: O00.90

## 2013-10-07 ENCOUNTER — Encounter (HOSPITAL_COMMUNITY): Payer: Self-pay

## 2013-10-07 ENCOUNTER — Encounter: Payer: Self-pay | Admitting: Emergency Medicine

## 2013-10-07 ENCOUNTER — Inpatient Hospital Stay (HOSPITAL_COMMUNITY)
Admission: AD | Admit: 2013-10-07 | Discharge: 2013-10-07 | Disposition: A | Discharge status: Home / Self Care | Payer: BC Managed Care – PPO | Source: Ambulatory Visit | Attending: Obstetrics & Gynecology | Admitting: Obstetrics & Gynecology

## 2013-10-07 ENCOUNTER — Emergency Department
Admission: EM | Admit: 2013-10-07 | Discharge: 2013-10-07 | Disposition: A | Discharge status: Home / Self Care | Payer: BC Managed Care – PPO | Source: Home / Self Care | Attending: Emergency Medicine | Admitting: Emergency Medicine

## 2013-10-07 ENCOUNTER — Inpatient Hospital Stay (HOSPITAL_COMMUNITY): Payer: BC Managed Care – PPO

## 2013-10-07 DIAGNOSIS — R1031 Right lower quadrant pain: Secondary | ICD-10-CM

## 2013-10-07 DIAGNOSIS — N898 Other specified noninflammatory disorders of vagina: Secondary | ICD-10-CM

## 2013-10-07 DIAGNOSIS — G8929 Other chronic pain: Secondary | ICD-10-CM

## 2013-10-07 DIAGNOSIS — M25551 Pain in right hip: Secondary | ICD-10-CM

## 2013-10-07 DIAGNOSIS — M25559 Pain in unspecified hip: Secondary | ICD-10-CM

## 2013-10-07 DIAGNOSIS — O9989 Other specified diseases and conditions complicating pregnancy, childbirth and the puerperium: Secondary | ICD-10-CM

## 2013-10-07 DIAGNOSIS — N939 Abnormal uterine and vaginal bleeding, unspecified: Secondary | ICD-10-CM

## 2013-10-07 DIAGNOSIS — O99891 Other specified diseases and conditions complicating pregnancy: Secondary | ICD-10-CM | POA: Insufficient documentation

## 2013-10-07 LAB — WET PREP, GENITAL
Trich, Wet Prep: NONE SEEN
Yeast Wet Prep HPF POC: NONE SEEN

## 2013-10-07 LAB — HCG, QUANTITATIVE, PREGNANCY: hCG, Beta Chain, Quant, S: 681 m[IU]/mL — ABNORMAL HIGH (ref <5)

## 2013-10-07 LAB — POCT URINALYSIS DIP (MANUAL ENTRY)
Bilirubin, UA: NEGATIVE
Glucose, UA: NEGATIVE
Ketones, POC UA: NEGATIVE
Leukocytes, UA: NEGATIVE
Nitrite, UA: NEGATIVE
Protein Ur, POC: NEGATIVE
Spec Grav, UA: <=1.005 (ref 1.005–1.03)
Urobilinogen, UA: 0.2 (ref 0–1)
pH, UA: 5.5 (ref 5–8)

## 2013-10-07 LAB — CBC
HCT: 36.8 % (ref 36.0–46.0)
Hemoglobin: 12.5 g/dL (ref 12.0–15.0)
MCHC: 34 g/dL (ref 30.0–36.0)
RBC: 4 MIL/uL (ref 3.87–5.11)
WBC: 4.8 10*3/uL (ref 4.0–10.5)

## 2013-10-07 LAB — POCT URINE PREGNANCY: Preg Test, Ur: POSITIVE

## 2013-10-07 MED ORDER — PRENATAL PLUS 27-1 MG PO TABS: 1.0000 | ORAL_TABLET | Freq: Every day | ORAL | Status: DC

## 2013-10-07 NOTE — ED Provider Notes (Signed)
CSN: [id number]     Arrival date & time 10/07/13  1553 History   First MD Initiated Contact with Patient 10/07/13 1559     Chief Complaint  Patient presents with  . Abdominal Pain  . Vaginal Bleeding  Pt c/o RLQ crampy abd pain and vaginal bleeding x today, with some nausea. Denies fever.    Patient is a 32 y.o. female presenting with vaginal bleeding. The history is provided by the patient.  Vaginal Bleeding Quality:  Bright red Severity: Was severe 4 hours ago, then tapered off. Onset quality:  Sudden Duration:  4 hours Timing:  Unable to specify Progression:  Improving Chronicity:  New Menstrual history: To her knowledge, LMP normal 11/12-11/19/2014. Possible pregnancy: yes (Married, monogamous with husband, not using contraception)   Context: spontaneously   Context: not genital trauma   Relieved by:  Rest Associated symptoms: abdominal pain (Right lower quadrant and suprapubic pelvic pain), dysuria (Possibly, patient is not sure), fatigue and nausea (Minimal. No vomiting)   Associated symptoms: no back pain, no dizziness, no fever and no vaginal discharge   Risk factors: unprotected sex   Risk factors: no bleeding disorder and no hx of ectopic pregnancy       Past Medical History  Diagnosis Date  . Gestational diabetes 2010    Glucometer testing 4x daily  . 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 Anal fissure 2010    after last pregnancy  . Urinary tract infection   . Hemorrhoids    Past Surgical History  Procedure Laterality Date  . Induced abortion     Family History  Problem Relation Age of Onset  . Heart failure Father     Alive  . Heart disease Father   . Stroke Maternal Grandfather     deceased  . Heart attack Maternal Grandmother     alive  . Heart disease Maternal Grandmother   . Anemia Sister     alive  . Diabetes Paternal Grandmother   . Depression Paternal Grandmother   . Anesthesia problems Neg Hx    History  Substance Use  Topics  . Smoking status: Former Smoker -- 0.25 packs/day for 10 years    Types: Cigarettes  . Smokeless tobacco: Never Used  . Alcohol Use: Yes   OB History   Grav Para Term Preterm Abortions TAB SAB Ect Mult Living   3 2 2  0 1 1 0 0 0 2     Review of Systems  Constitutional: Positive for fatigue. Negative for fever.  Gastrointestinal: Positive for nausea (Minimal. No vomiting) and abdominal pain (Right lower quadrant and suprapubic pelvic pain).  Genitourinary: Positive for dysuria (Possibly, patient is not sure) and vaginal bleeding. Negative for vaginal discharge.  Musculoskeletal: Negative for back pain.  Neurological: Negative for dizziness.  All other systems reviewed and are negative.    Allergies  Review of patient's allergies indicates no known allergies.  Home Medications   Current Outpatient Rx  Name  Route  Sig  Dispense  Refill  . amoxicillin-clavulanate (AUGMENTIN) 875-125 MG per tablet   Oral   Take 1 tablet by mouth 2 (two) times daily. For 10 days.   20 tablet   0   . docusate sodium (COLACE) 100 MG capsule   Oral   Take 100 mg by mouth at bedtime.          BP 106/70  Pulse 87  Temp(Src) 97.8 F (36.6 C) (Oral)  Resp  16  Wt 119 lb (53.978 kg)  SpO2 99%  LMP 09/28/2013 Physical Exam  Nursing note and vitals reviewed. Constitutional: She is oriented to person, place, and time. She appears well-developed and well-nourished. No distress.  HENT:  Mouth/Throat: Oropharynx is clear and moist.  Eyes: No scleral icterus.  Neck: Neck supple.  Cardiovascular: Normal rate, regular rhythm and normal heart sounds.   Pulmonary/Chest: Breath sounds normal.  Abdominal: Soft. She exhibits no mass. There is no hepatosplenomegaly. There is tenderness in the suprapubic area. There is no rebound, no guarding and no CVA tenderness.  Lymphadenopathy:    She has no cervical adenopathy.  Neurological: She is alert and oriented to person, place, and time.  Skin:  Skin is warm and dry.    ED Course  Procedures (including critical care time) Labs Review Labs Reviewed  URINE CULTURE  POCT URINALYSIS DIP (MANUAL ENTRY)  POCT URINE PREGNANCY   Imaging Review No results found.  EKG Interpretation    Date/Time:    Ventricular Rate:    PR Interval:    QRS Duration:   QT Interval:    QTC Calculation:   R Axis:     Text Interpretation:              MDM  Positive pregnancy test. Earlier today, crampy lower abdominal pain and some vaginal bleeding.--Need to rule out ectopic pregnancy.  Urinalysis shows large blood, otherwise negative. Urine pregnancy test here today is positive. Her gynecologist is not available, as the Center for [facility] [place] is closed right now. I called the GYN on-call, discussed, and we both agree that patient needs further evaluation to rule out ectopic pregnancy. Discussed with patient at length. Over 25 minute visit today. Patient will go to [facility] now, MAU, for stat evaluation and GYN on-call will evaluate and treat there. Husband is coming here to drive patient to [facility] now . Questions invited and answered. Patient voiced understanding and agreement with above.     Lajean Manes, MD 10/07/13 1719

## 2013-10-07 NOTE — MAU Note (Signed)
Patient states she was seen at [facility] today and had a positive pregnancy test. Has had cramping and bleeding. Patient states she has a normal period last week.

## 2013-10-07 NOTE — MAU Provider Note (Signed)
Chief Complaint: Abdominal Pain and Vaginal Bleeding   First Provider Initiated Contact with Patient 10/07/13 1932     SUBJECTIVE HPI: Sherry Page is a 32 y.o. [id number] at [redacted]w[redacted]d by LMP who presents for early pregnancy bleeding. Seen today at [facility] for same (note reviewed). She presented with abrupt onset at 11 am today of light red vaginal bleeding and severe right suprapubic abdominal pain. Pain "doubled her over" while in grocery store, now is constant throbbing. She was unaware of pregnancy and assumed she had a normal menses 09/28/13-10/05/13. (Onset was at expected time). Last intercourse Oct 26. Pregnancy desired. Blood type A pos  Past Medical History  Diagnosis Date  . Gestational diabetes 2010    Glucometer testing 4x daily  . 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 Anal fissure 2010    after last pregnancy  . Urinary tract infection   . Hemorrhoids    OB History  Gravida Para Term Preterm AB SAB TAB Ectopic Multiple Living  4 2 2  0 1 0 1 0 0 2    # Outcome Date GA Lbr Len/2nd Weight Sex Delivery Anes PTL Lv  4 CUR           3 TRM 01/19/12 [redacted]w[redacted]d 07:16 / 00:04 3.53 kg (7 lb 12.5 oz) F SVD None  Y     Comments: Normal  2 TRM 09/17/09 [redacted]w[redacted]d 24:00 3.221 kg (7 lb 1.6 oz) F SVD EPI N Y     Comments: Diet Controlled Gestational Diabetes  1 TAB 09/17/01 [redacted]w[redacted]d            Past Surgical History  Procedure Laterality Date  . Induced abortion     History   Social History  . Marital Status: Married    Spouse Name: Trey Paula    Number of Children: 1  . Years of Education: N/A   Occupational History  .     Social History Main Topics  . Smoking status: Former Smoker -- 0.25 packs/day for 10 years    Types: Cigarettes  . Smokeless tobacco: Never Used  . Alcohol Use: Yes  . Drug Use: No  . Sexual Activity: Yes   Other Topics Concern  . Not on file   Social History Narrative  . No narrative on file   No current facility-administered medications on  file prior to encounter.   No current outpatient prescriptions on file prior to encounter.   No Known Allergies  ROS: Pertinent items in HPI  OBJECTIVE Blood pressure 110/59, pulse 89, temperature 99 F (37.2 C), temperature source Oral, resp. rate 16, height 5\' 8"  (1.727 m), weight 53.978 kg (119 lb), last menstrual period 09/28/2013, SpO2 98.00%. GENERAL: Well-developed, well-nourished female in no acute distress.  HEENT: Normocephalic HEART: normal rate RESP: normal effort ABDOMEN: Soft, non-tender EXTREMITIES: Nontender, no edema NEURO: Alert and oriented SPECULUM EXAM: NEFG, small amount dark blood noted, cervix clean BIMANUAL: cervix L/C uterus upper normal size, mild-mod right adnexal tenderness, no masses  LAB RESULTS Results for orders placed during the hospital encounter of 10/07/13 (from the past 24 hour(s))  WET PREP, GENITAL     Status: Abnormal   Collection Time    10/07/13  7:47 PM      Result Value Range   Yeast Wet Prep HPF POC NONE SEEN  NONE SEEN   Trich, Wet Prep NONE SEEN  NONE SEEN   Clue Cells Wet Prep HPF POC NONE SEEN  NONE SEEN   WBC, Wet Prep HPF POC RARE (*) NONE SEEN  CBC     Status: None   Collection Time    10/07/13  7:50 PM      Result Value Range   WBC 4.8  4.0 - 10.5 K/uL   RBC 4.00  3.87 - 5.11 MIL/uL   Hemoglobin 12.5  12.0 - 15.0 g/dL   HCT 14.7  82.9 - 56.2 %   MCV 92.0  78.0 - 100.0 fL   MCH 31.3  26.0 - 34.0 pg   MCHC 34.0  30.0 - 36.0 g/dL   RDW 13.0  86.5 - 78.4 %   Platelets 169  150 - 400 K/uL  HCG, QUANTITATIVE, PREGNANCY     Status: Abnormal   Collection Time    10/07/13  7:50 PM      Result Value Range   hCG, Beta Chain, Quant, S 681 (*) <5 mIU/mL    IMAGING CLINICAL DATA: Vaginal bleeding, pregnant  EXAM:  OBSTETRIC <14 WK ULTRASOUND  TECHNIQUE:  Transabdominal ultrasound was performed for evaluation of the  gestation as well as the maternal uterus and adnexal regions.  COMPARISON: None.  FINDINGS:   Intrauterine gestational sac: Not visualized. There is questioned  small amount of clot within the endometrial cavity  Yolk sac: Not visualized  Embryo: Not visualize  Cardiac Activity: Not visualized  Maternal uterus/adnexae: There is a 2.4 x 1.2 cm hypoechoic lesion  adjacent and inseparable from the right ovary. The left ovary is  normal.  IMPRESSION:  No intrauterine gestational sac identified. Question small amount  clot within the endometrial cavity. Hypoechoic lesion adjacent and  inseparable from the right ovary. Ectopic pregnancy is not excluded.  Electronically Signed  By: Sherian Rein M.D.  On: 10/07/2013 21:11  MAU COURSE Pain described as throbbing after return from radiology, not severe.   ASSESSMENT 1. Pregnancy with abdominal pain of right lower quadrant, antepartum   [id number] very early pregnancy, suspicious for ectopic  PLAN C/W Dr. Erin Fulling  Discharge home with ectopic precautions     Medication List         prenatal vitamin w/FE, FA 27-1 MG Tabs tablet  Take 1 tablet by mouth daily.      Acetaminophen [phone] q6h prn pain    Danae Orleans, CNM 10/07/2013  7:32 PM

## 2013-10-07 NOTE — MAU Provider Note (Signed)
Pt was instructed to f/u in2 days for repeat bHCG or sooner prn increased pain or bleeding  Attestation of Attending Supervision of Advanced Practitioner (CNM/NP): Evaluation and management procedures were performed by the Advanced Practitioner under my supervision and collaboration.  I have reviewed the Advanced Practitioner's note and chart, and I agree with the management and plan.  HARRAWAY-SMITH, Sayaka Hoeppner 10:01 PM

## 2013-10-07 NOTE — ED Notes (Signed)
Pt c/o RLQ abd pain and vaginal bleeding x today, with some nausea. Denies fever.

## 2013-10-09 ENCOUNTER — Inpatient Hospital Stay (HOSPITAL_COMMUNITY)
Admission: AD | Admit: 2013-10-09 | Discharge: 2013-10-09 | Disposition: A | Discharge status: Home / Self Care | Payer: BC Managed Care – PPO | Source: Ambulatory Visit | Attending: Obstetrics & Gynecology | Admitting: Obstetrics & Gynecology

## 2013-10-09 DIAGNOSIS — Z87891 Personal history of nicotine dependence: Secondary | ICD-10-CM | POA: Insufficient documentation

## 2013-10-09 DIAGNOSIS — O200 Threatened abortion: Secondary | ICD-10-CM | POA: Insufficient documentation

## 2013-10-09 NOTE — MAU Note (Addendum)
Bleeding as heavy as period, "light flow" per patient. Denies cramping.

## 2013-10-09 NOTE — MAU Provider Note (Signed)
  History     CSN: [id number]  Arrival date and time: 10/09/13 1846   None     Chief Complaint  Patient presents with  . Follow-up   HPI  GWENLYN Page is a 32 y.o. [id number] at [redacted]w[redacted]d who presents today for repeat HCG. She states that she is having light bleeding like a period. She denies any pain at this time.   Past Medical History  Diagnosis Date  . Gestational diabetes 2010    Glucometer testing 4x daily  . 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 Anal fissure 2010    after last pregnancy  . Urinary tract infection   . Hemorrhoids     Past Surgical History  Procedure Laterality Date  . Induced abortion      Family History  Problem Relation Age of Onset  . Heart failure Father     Alive  . Heart disease Father   . Stroke Maternal Grandfather     deceased  . Heart attack Maternal Grandmother     alive  . Heart disease Maternal Grandmother   . Anemia Sister     alive  . Diabetes Paternal Grandmother   . Depression Paternal Grandmother   . Anesthesia problems Neg Hx     History  Substance Use Topics  . Smoking status: Former Smoker -- 0.25 packs/day for 10 years    Types: Cigarettes  . Smokeless tobacco: Never Used  . Alcohol Use: Yes    Allergies: No Known Allergies  Prescriptions prior to admission  Medication Sig Dispense Refill  . prenatal vitamin w/FE, FA (PRENATAL 1 + 1) 27-1 MG TABS tablet Take 1 tablet by mouth daily.  30 each  0    ROS Physical Exam   Blood pressure 99/51, pulse 87, temperature 98.6 F (37 C), temperature source Oral, resp. rate 20, last menstrual period 09/28/2013, SpO2 100.00%.  Physical Exam  Nursing note and vitals reviewed. Constitutional: She is oriented to person, place, and time. She appears well-developed and well-nourished. No distress.  Neurological: She is alert and oriented to person, place, and time.  Skin: Skin is warm and dry.  Psychiatric: She has a normal mood and affect.    MAU Course   Procedures  Results for MAKINZIE, CONSIDINE (MRN [id number]) as of 10/09/2013 20:09  Ref. Range 10/07/2013 19:50 10/07/2013 20:56 10/09/2013 19:15  hCG, Beta Chain, Quant, S Latest Range: <5 mIU/mL 681 (H)  573 (H)   2013: C/W Dr. Penne Lash, will repeat HCG in 48 hours.  Assessment and Plan   1. Threatened abortion in first trimester    Cannot exclude ectopic pregnancy Will repeat HCG in 48 hours Bleeding precautions and ectopic precautions reviewed   Sherry Page 10/09/2013, 8:11 PM

## 2013-10-10 NOTE — MAU Provider Note (Signed)
Attestation of Attending Supervision of Advanced Practitioner (CNM/NP): Evaluation and management procedures were performed by the Advanced Practitioner under my supervision and collaboration. I have reviewed the Advanced Practitioner's note and chart, and I agree with the management and plan.  Lashaun Krapf H. 6:19 AM   

## 2013-10-11 ENCOUNTER — Inpatient Hospital Stay (HOSPITAL_COMMUNITY): Payer: BC Managed Care – PPO

## 2013-10-11 ENCOUNTER — Inpatient Hospital Stay (HOSPITAL_COMMUNITY)
Admission: AD | Admit: 2013-10-11 | Discharge: 2013-10-11 | Disposition: A | Discharge status: Home / Self Care | Payer: BC Managed Care – PPO | Source: Ambulatory Visit | Attending: Family Medicine | Admitting: Family Medicine

## 2013-10-11 ENCOUNTER — Encounter (HOSPITAL_COMMUNITY): Payer: Self-pay

## 2013-10-11 DIAGNOSIS — O00109 Unspecified tubal pregnancy without intrauterine pregnancy: Secondary | ICD-10-CM | POA: Insufficient documentation

## 2013-10-11 DIAGNOSIS — O009 Unspecified ectopic pregnancy without intrauterine pregnancy: Secondary | ICD-10-CM

## 2013-10-11 LAB — BUN: BUN: 14 mg/dL (ref 6–23)

## 2013-10-11 LAB — CREATININE, SERUM
Creatinine, Ser: 0.88 mg/dL (ref 0.50–1.10)
GFR calc Af Amer: >90 mL/min (ref >90)

## 2013-10-11 LAB — CBC
HCT: 37.4 % (ref 36.0–46.0)
Hemoglobin: 12.9 g/dL (ref 12.0–15.0)
MCHC: 34.5 g/dL (ref 30.0–36.0)
MCV: 91.9 fL (ref 78.0–100.0)

## 2013-10-11 LAB — HCG, QUANTITATIVE, PREGNANCY: hCG, Beta Chain, Quant, S: 414 m[IU]/mL — ABNORMAL HIGH (ref <5)

## 2013-10-11 MED ORDER — METHOTREXATE INJECTION FOR WOMEN'S HOSPITAL
50.0000 mg/m2 | Freq: Once | INTRAMUSCULAR | Status: AC
  Administered 2013-10-11: 80 mg via INTRAMUSCULAR
  Filled 2013-10-11: qty 1.6

## 2013-10-11 NOTE — MAU Note (Signed)
Pt reports no pain, bleeding one pad per hour.

## 2013-10-11 NOTE — MAU Provider Note (Signed)
Chart reviewed and agree with management and plan.  

## 2013-10-11 NOTE — MAU Note (Signed)
Pt reports she is here for labwork to monitor HCG levels.  States she is started having vag bldg on Friday and now she is changing her pad every 2 hours.

## 2013-10-11 NOTE — MAU Provider Note (Signed)
History     CSN: [id number]  Arrival date and time: 10/11/13 1850   None     Chief Complaint  Patient presents with  . Follow-up   HPI  Sherry Page is a 32 y.o. [id number] at [redacted]w[redacted]d who presents today for FU HCG. She states that she has been bleeding similar to a period since her last visit here. She denies any pain at this time.   Past Medical History  Diagnosis Date  . Gestational diabetes 2010    Glucometer testing 4x daily  . 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 Anal fissure 2010    after last pregnancy  . Urinary tract infection   . Hemorrhoids     Past Surgical History  Procedure Laterality Date  . Induced abortion      Family History  Problem Relation Age of Onset  . Heart failure Father     Alive  . Heart disease Father   . Stroke Maternal Grandfather     deceased  . Heart attack Maternal Grandmother     alive  . Heart disease Maternal Grandmother   . Anemia Sister     alive  . Diabetes Paternal Grandmother   . Depression Paternal Grandmother   . Anesthesia problems Neg Hx     History  Substance Use Topics  . Smoking status: Former Smoker -- 0.25 packs/day for 10 years    Types: Cigarettes  . Smokeless tobacco: Never Used  . Alcohol Use: Yes    Allergies: No Known Allergies  No prescriptions prior to admission    ROS Physical Exam   Blood pressure 94/74, pulse 93, temperature 98.7 F (37.1 C), temperature source Oral, resp. rate 18, height 5\' 8"  (1.727 m), weight 53.978 kg (119 lb), last menstrual period 09/28/2013, SpO2 100.00%.  Physical Exam  Nursing note and vitals reviewed. Constitutional: She is oriented to person, place, and time. She appears well-developed and well-nourished. No distress.  Cardiovascular: Normal rate.   Respiratory: Effort normal.  GI: Soft. There is no tenderness.  Neurological: She is alert and oriented to person, place, and time.  Skin: Skin is warm and dry.  Psychiatric: She has a normal  mood and affect.    MAU Course  Procedures  Results for Sherry Page, Sherry Page (MRN [id number]) as of 10/11/2013 20:14  Ref. Range 10/07/2013 19:50 10/07/2013 20:56 10/09/2013 19:15 10/11/2013 19:33  hCG, Beta Chain, Quant, S Latest Range: <5 mIU/mL 681 (H)  573 (H) 414 (H)   Results for orders placed during the hospital encounter of 10/11/13 (from the past 24 hour(s))  HCG, QUANTITATIVE, PREGNANCY     Status: Abnormal   Collection Time    10/11/13  7:33 PM      Result Value Range   hCG, Beta Chain, Quant, S 414 (*) <5 mIU/mL  CBC     Status: None   Collection Time    10/11/13  7:33 PM      Result Value Range   WBC 6.4  4.0 - 10.5 K/uL   RBC 4.07  3.87 - 5.11 MIL/uL   Hemoglobin 12.9  12.0 - 15.0 g/dL   HCT 13.0  86.5 - 78.4 %   MCV 91.9  78.0 - 100.0 fL   MCH 31.7  26.0 - 34.0 pg   MCHC 34.5  30.0 - 36.0 g/dL   RDW 69.6  29.5 - 28.4 %   Platelets 185  150 - 400 K/uL  AST  Status: None   Collection Time    10/11/13  7:34 PM      Result Value Range   AST 17  0 - 37 U/L  BUN     Status: None   Collection Time    10/11/13  7:34 PM      Result Value Range   BUN 14  6 - 23 mg/dL  CREATININE, SERUM     Status: Abnormal   Collection Time    10/11/13  7:34 PM      Result Value Range   Creatinine, Ser 0.88  0.50 - 1.10 mg/dL   GFR calc non Af Amer 85 (*) >90 mL/min   GFR calc Af Amer >90  >90 mL/min   US Ob Transvaginal  10/11/2013   CLINICAL DATA:  Vaginal bleeding and pelvic pain. Abnormal fluctuation of beta HCG levels.  EXAM: TRANSVAGINAL OB ULTRASOUND  TECHNIQUE: Transvaginal ultrasound was performed for complete evaluation of the gestation as well as the maternal uterus, adnexal regions, and pelvic cul-de-sac.  COMPARISON:  10/07/2013  FINDINGS: No intrauterine gestational sac visualized. No other fluid collections seen within the endometrial cavity. No fibroids identified.  Both ovaries are normal in appearance. A hypoechoic adnexal mass is seen in the right adnexa medial to  the ovary which measures 3.4 x 1.6 x 1.8 cm. This is highly suspicious for ectopic pregnancy. No evidence of free fluid.  IMPRESSION: 3.4 cm right adnexal mass, consistent with probable ectopic pregnancy.  No evidence of hemoperitoneum.  Critical Value/emergent results were called by telephone at the time of interpretation on 10/11/2013 at 9:20 PM to [facility] , who verbally acknowledged these results.   Electronically Signed   By: Myles Rosenthal M.D.   On: 10/11/2013 21:21   2155: D/W Dr. Shawnie Pons, will give MTX.  Assessment and Plan   1. Ectopic pregnancy    Follow up here in MAU on Friday Ectopic precautions given, return sooner if severe pain develops  Sherry Page 10/11/2013, 9:52 PM

## 2013-10-14 ENCOUNTER — Inpatient Hospital Stay (HOSPITAL_COMMUNITY)
Admission: AD | Admit: 2013-10-14 | Discharge: 2013-10-14 | Disposition: A | Discharge status: Home / Self Care | Payer: BC Managed Care – PPO | Source: Ambulatory Visit | Attending: Obstetrics and Gynecology | Admitting: Obstetrics and Gynecology

## 2013-10-14 DIAGNOSIS — Z3201 Encounter for pregnancy test, result positive: Secondary | ICD-10-CM

## 2013-10-14 DIAGNOSIS — O00109 Unspecified tubal pregnancy without intrauterine pregnancy: Secondary | ICD-10-CM | POA: Insufficient documentation

## 2013-10-14 DIAGNOSIS — O009 Unspecified ectopic pregnancy without intrauterine pregnancy: Secondary | ICD-10-CM

## 2013-10-14 LAB — HCG, QUANTITATIVE, PREGNANCY: hCG, Beta Chain, Quant, S: 197 m[IU]/mL — ABNORMAL HIGH (ref <5)

## 2013-10-14 NOTE — MAU Note (Signed)
Pt states here for repeat bhcg, took 1000mg  tylenol yesterday for cramping, and pain was relieved. States bleeding significantly decreased. Changing pad q3-4 hours. Denies pain at present.

## 2013-10-14 NOTE — MAU Provider Note (Signed)
S: 32 y.o. [id number] @[redacted]w[redacted]d  presents to MAU today for Day 4 MTX follow up.  She denies abdominal pain or bleeding today.  Yesterday she had bleeding described as like her normal menses and afterwards felt better. Prior to this she had mild abdominal cramping.  She denies vaginal itching/burning, urinary symptoms, h/a, dizziness, n/v, or fever/chills.    O: BP 110/65  Pulse 86  Temp(Src) 98.5 F (36.9 C) (Oral)  Resp 18  Ht 5\' 8"  (1.727 m)  Wt 54.205 kg (119 lb 8 oz)  BMI 18.17 kg/m2  LMP 09/28/2013  Results for orders placed during the hospital encounter of 10/14/13 (from the past 168 hour(s))  HCG, QUANTITATIVE, PREGNANCY   Collection Time    10/14/13 10:11 AM      Result Value Range   hCG, Beta Chain, Quant, S 197 (*) <5 mIU/mL  HCG, QUANTITATIVE, PREGNANCY   Collection Time    10/09/13  7:15 PM      Result Value Range   hCG, Beta Chain, Quant, S 573 (*) <5 mIU/mL  HCG, QUANTITATIVE, PREGNANCY   Collection Time    10/07/13  7:50 PM      Result Value Range   hCG, Beta Chain, Quant, S 681 (*) <5 mIU/mL    A: Day 4 MTX with appropriate drop in quant hcg  P: F/U on Monday as scheduled Return to MAU sooner as needed--precautions given  Sharen Counter Certified Nurse-Midwife

## 2013-10-15 NOTE — MAU Provider Note (Signed)
Attestation of Attending Supervision of Advanced Practitioner: Evaluation and management procedures were performed by the PA/NP/CNM/OB Fellow under my supervision/collaboration. Chart reviewed and agree with management and plan.  Milanya Sunderland V 10/15/2013 10:17 PM

## 2013-10-17 ENCOUNTER — Inpatient Hospital Stay (HOSPITAL_COMMUNITY)
Admission: AD | Admit: 2013-10-17 | Discharge: 2013-10-17 | Disposition: A | Discharge status: Home / Self Care | Payer: BC Managed Care – PPO | Source: Ambulatory Visit | Attending: Obstetrics & Gynecology | Admitting: Obstetrics & Gynecology

## 2013-10-17 DIAGNOSIS — O009 Unspecified ectopic pregnancy without intrauterine pregnancy: Secondary | ICD-10-CM

## 2013-10-17 DIAGNOSIS — O00109 Unspecified tubal pregnancy without intrauterine pregnancy: Secondary | ICD-10-CM | POA: Insufficient documentation

## 2013-10-17 LAB — HCG, QUANTITATIVE, PREGNANCY: hCG, Beta Chain, Quant, S: 143 m[IU]/mL — ABNORMAL HIGH (ref <5)

## 2013-10-17 NOTE — MAU Provider Note (Signed)
HPI:  Ms. Sherry Page is a 32 y.o. female who presents for day #7 follow up methotrexate. She is currently having very minimal bleeding, still continues to have some mild cramping, however is not worse than what she has been experiencing.   Objective:  GENERAL: Well-developed, well-nourished female in no acute distress.  HEENT: Normocephalic, atraumatic.   LUNGS: Effort normal HEART: Regular rate  SKIN: Warm, dry and without erythema PSYCH: Normal mood and affect   Filed Vitals:   10/17/13 0957  BP: 120/55  Pulse: 81  Resp: 18   Results for orders placed during the hospital encounter of 10/17/13 (from the past 24 hour(s))  HCG, QUANTITATIVE, PREGNANCY     Status: Abnormal   Collection Time    10/17/13  9:57 AM      Result Value Range   hCG, Beta Chain, Quant, S 143 (*) <5 mIU/mL    Beta Hcg: 11/25: 414 11/28: 197 12/1: 143  Discussed with Dr. Macon Large labs and [place]. Ok to have patient follow up in one week to the clinic.   A: Ectopic pregnancy Methotrexate follow up  P: Disharge home Follow up in the clinic in 1 week, the clinic will call you to schedule the follow up appointtment Return to MAU sooner if needed If pain increases return to MAU ASAP Pelvic rest discussed Bleeding discussed  Iona Hansen Rasch, NP 10/17/2013 11:17 AM

## 2013-10-17 NOTE — MAU Note (Signed)
Pt states here for f/u BHCG, is having some cramping, unsure if that is normal at this stage in process. States bleeding has picked up somewhat however not bleeding heavily.

## 2013-10-20 NOTE — MAU Provider Note (Signed)
Attestation of Attending Supervision of Advanced Practitioner (PA/CNM/NP): Evaluation and management procedures were performed by the Advanced Practitioner under my supervision and collaboration.  I have reviewed the Advanced Practitioner's note and chart, and I agree with the management and plan.  Esaiah Wanless, MD, FACOG Attending Obstetrician & Gynecologist Faculty Practice, [facility]  

## 2013-10-25 ENCOUNTER — Other Ambulatory Visit: Payer: BC Managed Care – PPO

## 2013-10-25 DIAGNOSIS — O009 Unspecified ectopic pregnancy without intrauterine pregnancy: Secondary | ICD-10-CM

## 2013-10-26 LAB — HCG, QUANTITATIVE, PREGNANCY: hCG, Beta Chain, Quant, S: 44.1 m[IU]/mL

## 2013-10-31 ENCOUNTER — Telehealth: Payer: Self-pay | Admitting: *Deleted

## 2013-10-31 NOTE — Telephone Encounter (Signed)
Pt left message stating that she is being treated for an ectopic pregnancy and has appt tomorrow for a lab draw.  She had stopped bleeding for 1 week and now has started to bleed again. She wants to know if this is normal and if she should see a doctor tomorrow when she comes for the lab draw. Her last hormone level was 44.  I called pt and left message on her personal voice mail. I stated that the bleeding is common in the scenario of an ectopic pregnancy. It is possible that this is a new menstrual period. She does not need to see the doctor tomorrow at her lab draw visit. We will wait to see those results and if there are abnormalities or concerns, will follow up accordingly. If she has additional questions, we will be happy to answer them at her visit tomorrow.

## 2013-11-01 ENCOUNTER — Other Ambulatory Visit: Payer: BC Managed Care – PPO

## 2013-11-01 DIAGNOSIS — O009 Unspecified ectopic pregnancy without intrauterine pregnancy: Secondary | ICD-10-CM

## 2013-11-02 ENCOUNTER — Telehealth: Payer: Self-pay | Admitting: *Deleted

## 2013-11-02 LAB — HCG, QUANTITATIVE, PREGNANCY: hCG, Beta Chain, Quant, S: <2 m[IU]/mL

## 2013-11-02 NOTE — Telephone Encounter (Signed)
Pt left message requesting her lab results from yesterday. Also she reported that she is still having moderate amount of bleeding and passing clots. She does not have any cramping. She wants to know if this is normal.

## 2013-11-02 NOTE — Telephone Encounter (Signed)
Called patient back stating I was returning her phone call. Patient requested blood work results from yesterday. Informed patient of b hcg of less than 2. Patient was satisfied and asked if she could return taking her vitamins again, told patient that since her levels were less than 2, she could start vitamins back again. Patient stated her bleeding stopped completely for a week and has started again and wants to know if she could use tampons now, told patient that would be fine. Patient verbalized understanding and had no further questions

## 2013-11-17 NOTE — L&D Delivery Note (Signed)
Delivery Note Pt began pushing spontaneously in the tub. With the third ctx the head became visible and shortly after at 4:42 AM a viable female was delivered via Vaginal, Spontaneous Delivery (Presentation: LOA  )- waterbirth.  APGAR: 8, 9; weight: pending .  Cord clamped and cut by FOB. Father then took the baby skin-to-skin and the pt was tx to the bed for placenta delivery.  Placenta status: Intact, spont .  Cord:  3 vessel  Anesthesia: None  Episiotomy: None Lacerations: None (sm 1st degree perineal skin separation- not bldg, not repaired) Est. Blood Loss (mL): 250  Mom to postpartum.  Baby to Couplet care / Skin to Skin.  [name], [name] CNM 09/14/2014, 5:24 AM

## 2013-12-26 ENCOUNTER — Telehealth: Payer: Self-pay | Admitting: *Deleted

## 2013-12-26 DIAGNOSIS — B3731 Acute candidiasis of vulva and vagina: Secondary | ICD-10-CM

## 2013-12-26 DIAGNOSIS — B373 Candidiasis of vulva and vagina: Secondary | ICD-10-CM

## 2013-12-26 MED ORDER — FLUCONAZOLE 150 MG PO TABS: 150.0000 mg | ORAL_TABLET | Freq: Once | ORAL | Status: DC

## 2013-12-26 NOTE — Telephone Encounter (Signed)
Pt called stating that she has had vaginal itching and burning since Sat night and it hurts to wipe when she goes to the bathroom.  She has used OTC Monistat without relief.  Per protocol Diflucan 150 mg called to pharmacy.  Pt encouraged to used Tucks or baking soda baths to help sooth the discomfort.  If she is not any better by Wed she is instructed to call for an appt.

## 2014-01-02 ENCOUNTER — Telehealth: Payer: Self-pay | Admitting: *Deleted

## 2014-01-02 ENCOUNTER — Other Ambulatory Visit: Payer: BC Managed Care – PPO

## 2014-01-02 DIAGNOSIS — Z3200 Encounter for pregnancy test, result unknown: Secondary | ICD-10-CM

## 2014-01-02 LAB — HCG, QUANTITATIVE, PREGNANCY: HCG, BETA CHAIN, QUANT, S: 1866 m[IU]/mL

## 2014-01-02 NOTE — Telephone Encounter (Signed)
Pt notified of BHCG of 1866.0  Will repeat BHCG in 48 hours.  Pt is aware that if she is having any bleeding or pain will go straight to the nearest ED.

## 2014-01-02 NOTE — Progress Notes (Signed)
Pt here stating that she had ectopic in Nov that was treated with Methotrexate.  She took a pregnancy test over the weekend which was positive.  BHCG drawn stat today.  Will f/u on Wed.  Pt given ectopic precautions and will proceed [facility] if she starts bleeding or has pain.

## 2014-01-04 ENCOUNTER — Telehealth: Payer: Self-pay | Admitting: *Deleted

## 2014-01-04 ENCOUNTER — Other Ambulatory Visit (INDEPENDENT_AMBULATORY_CARE_PROVIDER_SITE_OTHER): Payer: BC Managed Care – PPO

## 2014-01-04 DIAGNOSIS — Z3490 Encounter for supervision of normal pregnancy, unspecified, unspecified trimester: Secondary | ICD-10-CM

## 2014-01-04 DIAGNOSIS — O0281 Inappropriate change in quantitative human chorionic gonadotropin (hCG) in early pregnancy: Secondary | ICD-10-CM

## 2014-01-04 DIAGNOSIS — Z3200 Encounter for pregnancy test, result unknown: Secondary | ICD-10-CM

## 2014-01-04 DIAGNOSIS — Z8759 Personal history of other complications of pregnancy, childbirth and the puerperium: Secondary | ICD-10-CM

## 2014-01-04 LAB — HCG, QUANTITATIVE, PREGNANCY: hCG, Beta Chain, Quant, S: 3698 m[IU]/mL

## 2014-01-04 MED ORDER — PRENATAL MULTIVITAMIN CH: 1.0000 | ORAL_TABLET | Freq: Every day | ORAL | Status: DC

## 2014-01-04 NOTE — Telephone Encounter (Signed)
Pt request refill on PNV.  [facility].

## 2014-01-04 NOTE — Progress Notes (Signed)
Pt here for repeat BHCG and TVU.  U/S showed IUP with yolk sac and Lt CL noted.  No free fluid noted.  Will return in 1 and 1/2 weeks for repeat U/S/

## 2014-01-11 ENCOUNTER — Telehealth: Payer: Self-pay

## 2014-01-11 ENCOUNTER — Ambulatory Visit (INDEPENDENT_AMBULATORY_CARE_PROVIDER_SITE_OTHER): Payer: BC Managed Care – PPO | Admitting: Obstetrics & Gynecology

## 2014-01-11 ENCOUNTER — Encounter: Payer: Self-pay | Admitting: Obstetrics & Gynecology

## 2014-01-11 VITALS — BP 98/59 | Wt 119.0 lb

## 2014-01-11 DIAGNOSIS — Z3480 Encounter for supervision of other normal pregnancy, unspecified trimester: Secondary | ICD-10-CM

## 2014-01-11 LAB — HIV ANTIBODY (ROUTINE TESTING W REFLEX): HIV: NONREACTIVE

## 2014-01-11 LAB — HCG, QUANTITATIVE, PREGNANCY: HCG, BETA CHAIN, QUANT, S: 22229 m[IU]/mL

## 2014-01-11 MED ORDER — PRAMOXINE HCL 1 % RE FOAM: 1.0000 "application " | Freq: Three times a day (TID) | RECTAL | Status: DC | PRN

## 2014-01-11 NOTE — Progress Notes (Signed)
p- 94   Currently having anal fissures and would like med for that

## 2014-01-11 NOTE — Progress Notes (Signed)
Subjective:    Sherry Page is a [id number] Unknown being seen today for her first obstetrical visit.  Her obstetrical history is significant for h/o GDM. Patient does intend to breast feed. Pregnancy history fully reviewed.  Patient reports no complaints.  Filed Vitals:   01/11/14 0929  BP: 98/59  Weight: 119 lb (53.978 kg)    HISTORY: OB History  Gravida Para Term Preterm AB SAB TAB Ectopic Multiple Living  5 2 2  0 2 0 1 1 0 2    # Outcome Date GA Lbr Len/2nd Weight Sex Delivery Anes PTL Lv  5 CUR           4 TRM 01/19/12 [redacted]w[redacted]d 07:16 / 00:04 7 lb 12.5 oz (3.53 kg) F SVD None  Y     Comments: Normal  3 TRM 09/17/09 [redacted]w[redacted]d 24:00 7 lb 1.6 oz (3.221 kg) F SVD EPI N Y     Comments: Diet Controlled Gestational Diabetes  2 TAB 09/17/01 [redacted]w[redacted]d         1 ECT              Comments: System Generated. Please review and update pregnancy details.     Past Medical History  Diagnosis Date  . 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 Anal fissure 2010    after last pregnancy  . Urinary tract infection   . Hemorrhoids   . Gestational diabetes 2010    Glucometer testing 4x daily  . Ectopic pregnancy 11/14    methotrexate   Past Surgical History  Procedure Laterality Date  . Induced abortion     Family History  Problem Relation Age of Onset  . Heart failure Father     Alive  . Heart disease Father   . Stroke Maternal Grandfather     deceased  . Heart attack Maternal Grandmother     alive  . Heart disease Maternal Grandmother   . Anemia Sister     alive  . Diabetes Paternal Grandmother   . Depression Paternal Grandmother   . Anesthesia problems Neg Hx      Exam    Uterus:     Pelvic Exam:    Perineum: No Hemorrhoids   Vulva: normal   Vagina:  normal mucosa   pH:    Cervix: anteverted   Adnexa: normal adnexa   Bony Pelvis: android  System: Breast:  normal appearance, no masses or tenderness   Skin: normal coloration and turgor, no rashes    Neurologic:  oriented   Extremities: normal strength, tone, and muscle mass   HEENT PERRLA   Mouth/Teeth mucous membranes moist, pharynx normal without lesions   Neck supple   Cardiovascular: regular rate and rhythm   Respiratory:  appears well, vitals normal, no respiratory distress, acyanotic, normal RR, ear and throat exam is normal, neck free of mass or lymphadenopathy, chest clear, no wheezing, crepitations, rhonchi, normal symmetric air entry   Abdomen: soft, non-tender; bowel sounds normal; no masses,  no organomegaly   Urinary: urethral meatus normal      Assessment:    Pregnancy: [id number] Patient Active Problem List   Diagnosis Date Noted  . Ectopic pregnancy 10/14/2013  . NSVD (normal spontaneous vaginal delivery) 01/19/2012  . Previous gestational diabetes mellitus, antepartum 06/13/2011        Plan:     Initial labs drawn. Prenatal vitamins. Problem list reviewed and updated. Genetic Screening discussed First Screen: undecided.  Ultrasound discussed;  fetal survey: requested.  Follow up in 2 weeks. [facility] because on today's bedside u/s the FHR appeared to be about 70. Normal adnexa.   Katana Berthold C. 01/11/2014

## 2014-01-11 NOTE — Telephone Encounter (Signed)
Spoke with Dr. Marice Potterove and was informed to call the pharamacy to get a recommendation.  Pharmacist does not have recommendation besides preparation H, otc.  Noticed that [place] is a pt of [place] office will send inbasket message to Juline PatchLori Clark and inform her that Dr. Marice Potterove recommended the same, preparation H.

## 2014-01-11 NOTE — Telephone Encounter (Signed)
[facility] and stated that pt insurance does not cover the pramoxine and she would have to pay out of pocket which was to expensive for her to pay.  They asked if something else could be prescribed.  Re: Dr. Marice Potterove in clinic today (01/11/14) will seek her for advisement.

## 2014-01-11 NOTE — Telephone Encounter (Signed)
Spoke with pt about the cost of her proctofoam and if it is too expensive she will use hydrocortisone cream and tucks pads.  Pt will call the pharmacy to check on the RX amount.

## 2014-01-12 LAB — OBSTETRIC PANEL
Antibody Screen: NEGATIVE
Basophils Absolute: 0.1 10*3/uL (ref 0.0–0.1)
Basophils Relative: 1 % (ref 0–1)
EOS ABS: 0.1 10*3/uL (ref 0.0–0.7)
Eosinophils Relative: 2 % (ref 0–5)
HCT: 39.7 % (ref 36.0–46.0)
HEP B S AG: NEGATIVE
Hemoglobin: 13.5 g/dL (ref 12.0–15.0)
LYMPHS ABS: 1.8 10*3/uL (ref 0.7–4.0)
Lymphocytes Relative: 32 % (ref 12–46)
MCH: 31.9 pg (ref 26.0–34.0)
MCHC: 34 g/dL (ref 30.0–36.0)
MCV: 93.9 fL (ref 78.0–100.0)
Monocytes Absolute: 0.4 10*3/uL (ref 0.1–1.0)
Monocytes Relative: 7 % (ref 3–12)
NEUTROS ABS: 3.2 10*3/uL (ref 1.7–7.7)
Neutrophils Relative %: 58 % (ref 43–77)
PLATELETS: 194 10*3/uL (ref 150–400)
RBC: 4.23 MIL/uL (ref 3.87–5.11)
RDW: 13.2 % (ref 11.5–15.5)
RH TYPE: POSITIVE
Rubella: 0.64 Index (ref <0.90)
WBC: 5.6 10*3/uL (ref 4.0–10.5)

## 2014-01-12 LAB — GC/CHLAMYDIA PROBE AMP
CT Probe RNA: NEGATIVE
GC PROBE AMP APTIMA: NEGATIVE

## 2014-01-12 LAB — CULTURE, OB URINE
COLONY COUNT: NO GROWTH
Organism ID, Bacteria: NO GROWTH

## 2014-01-18 ENCOUNTER — Ambulatory Visit (HOSPITAL_COMMUNITY)
Admission: RE | Admit: 2014-01-18 | Discharge: 2014-01-18 | Disposition: A | Discharge status: Home / Self Care | Payer: BC Managed Care – PPO | Source: Ambulatory Visit | Attending: Obstetrics & Gynecology | Admitting: Obstetrics & Gynecology

## 2014-01-18 DIAGNOSIS — Z3480 Encounter for supervision of other normal pregnancy, unspecified trimester: Secondary | ICD-10-CM

## 2014-01-18 DIAGNOSIS — N831 Corpus luteum cyst of ovary, unspecified side: Secondary | ICD-10-CM | POA: Insufficient documentation

## 2014-01-18 DIAGNOSIS — O34599 Maternal care for other abnormalities of gravid uterus, unspecified trimester: Secondary | ICD-10-CM | POA: Insufficient documentation

## 2014-01-18 DIAGNOSIS — Z3689 Encounter for other specified antenatal screening: Secondary | ICD-10-CM | POA: Insufficient documentation

## 2014-01-18 DIAGNOSIS — O208 Other hemorrhage in early pregnancy: Secondary | ICD-10-CM | POA: Insufficient documentation

## 2014-01-19 ENCOUNTER — Ambulatory Visit (HOSPITAL_COMMUNITY): Payer: BC Managed Care – PPO

## 2014-01-19 ENCOUNTER — Encounter: Payer: Self-pay | Admitting: Obstetrics & Gynecology

## 2014-01-27 ENCOUNTER — Ambulatory Visit (INDEPENDENT_AMBULATORY_CARE_PROVIDER_SITE_OTHER): Payer: BC Managed Care – PPO | Admitting: Advanced Practice Midwife

## 2014-01-27 ENCOUNTER — Encounter: Payer: Self-pay | Admitting: Advanced Practice Midwife

## 2014-01-27 VITALS — BP 104/61 | Wt 123.0 lb

## 2014-01-27 DIAGNOSIS — O09299 Supervision of pregnancy with other poor reproductive or obstetric history, unspecified trimester: Secondary | ICD-10-CM

## 2014-01-27 DIAGNOSIS — Z8632 Personal history of gestational diabetes: Secondary | ICD-10-CM

## 2014-01-27 DIAGNOSIS — R319 Hematuria, unspecified: Secondary | ICD-10-CM

## 2014-01-27 LAB — POCT URINALYSIS DIPSTICK
Bilirubin, UA: NEGATIVE
GLUCOSE UA: NEGATIVE
Ketones, UA: NEGATIVE
LEUKOCYTES UA: NEGATIVE
NITRITE UA: NEGATIVE
Protein, UA: NEGATIVE
Spec Grav, UA: 1.015
UROBILINOGEN UA: 0.2
pH, UA: 6.5

## 2014-01-27 NOTE — Progress Notes (Signed)
Doing well except ginger not helping nausea. Drinking a coke will help. Will try sample of Diclegis. Recommend early glucola. Will do at next visit.

## 2014-01-27 NOTE — Progress Notes (Signed)
P - 79 - Pt c/o nausea and extreme hunger - Beside u/s shows FHR 173 - Urine culture to be sent for Dx of Hematuria

## 2014-01-27 NOTE — Patient Instructions (Signed)
Pregnancy - First Trimester  During sexual intercourse, millions of sperm go into the vagina. Only 1 sperm will penetrate and fertilize the female egg while it is in the Fallopian tube. One week later, the fertilized egg implants into the wall of the uterus. An embryo begins to develop into a baby. At 6 to 8 weeks, the eyes and face are formed and the heartbeat can be seen on ultrasound. At the end of 12 weeks (first trimester), all the baby's organs are formed. Now that you are pregnant, you will want to do everything you can to have a healthy baby. Two of the most important things are to get good prenatal care and follow your caregiver's instructions. Prenatal care is all the medical care you receive before the baby's birth. It is given to prevent, find, and treat problems during the pregnancy and childbirth.  PRENATAL EXAMS  · During prenatal visits, your weight, blood pressure, and urine are checked. This is done to make sure you are healthy and progressing normally during the pregnancy.  · A pregnant woman should gain 25 to 35 pounds during the pregnancy. However, if you are overweight or underweight, your caregiver will advise you regarding your weight.  · Your caregiver will ask and answer questions for you.  · Blood work, cervical cultures, other necessary tests, and a Pap test are done during your prenatal exams. These tests are done to check on your health and the probable health of your baby. Tests are strongly recommended and done for HIV with your permission. This is the virus that causes AIDS. These tests are done because medicines can be given to help prevent your baby from being born with this infection should you have been infected without knowing it. Blood work is also used to find out your blood type, previous infections, and follow your blood levels (hemoglobin).  · Low hemoglobin (anemia) is common during pregnancy. Iron and vitamins are given to help prevent this. Later in the pregnancy, blood  tests for diabetes will be done along with any other tests if any problems develop.  · You may need other tests to make sure you and the baby are doing well.  CHANGES DURING THE FIRST TRIMESTER   Your body goes through many changes during pregnancy. They vary from person to person. Talk to your caregiver about changes you notice and are concerned about. Changes can include:  · Your menstrual period stops.  · The egg and sperm carry the genes that determine what you look like. Genes from you and your partner are forming a baby. The female genes determine whether the baby is a boy or a girl.  · Your body increases in girth and you may feel bloated.  · Feeling sick to your stomach (nauseous) and throwing up (vomiting). If the vomiting is uncontrollable, call your caregiver.  · Your breasts will begin to enlarge and become tender.  · Your nipples may stick out more and become darker.  · The need to urinate more. Painful urination may mean you have a bladder infection.  · Tiring easily.  · Loss of appetite.  · Cravings for certain kinds of food.  · At first, you may gain or lose a couple of pounds.  · You may have changes in your emotions from day to day (excited to be pregnant or concerned something may go wrong with the pregnancy and baby).  · You may have more vivid and strange dreams.  HOME CARE INSTRUCTIONS   ·   It is very important to avoid all smoking, alcohol and non-prescribed drugs during your pregnancy. These affect the formation and growth of the baby. Avoid chemicals while pregnant to ensure the delivery of a healthy infant.  · Start your prenatal visits by the 12th week of pregnancy. They are usually scheduled monthly at first, then more often in the last 2 months before delivery. Keep your caregiver's appointments. Follow your caregiver's instructions regarding medicine use, blood and lab tests, exercise, and diet.  · During pregnancy, you are providing food for you and your baby. Eat regular, well-balanced  meals. Choose foods such as meat, fish, milk and other low fat dairy products, vegetables, fruits, and whole-grain breads and cereals. Your caregiver will tell you of the ideal weight gain.  · You can help morning sickness by keeping soda crackers at the bedside. Eat a couple before arising in the morning. You may want to use the crackers without salt on them.  · Eating 4 to 5 small meals rather than 3 large meals a day also may help the nausea and vomiting.  · Drinking liquids between meals instead of during meals also seems to help nausea and vomiting.  · A physical sexual relationship may be continued throughout pregnancy if there are no other problems. Problems may be early (premature) leaking of amniotic fluid from the membranes, vaginal bleeding, or belly (abdominal) pain.  · Exercise regularly if there are no restrictions. Check with your caregiver or physical therapist if you are unsure of the safety of some of your exercises. Greater weight gain will occur in the last 2 trimesters of pregnancy. Exercising will help:  · Control your weight.  · Keep you in shape.  · Prepare you for labor and delivery.  · Help you lose your pregnancy weight after you deliver your baby.  · Wear a good support or jogging bra for breast tenderness during pregnancy. This may help if worn during sleep too.  · Ask when prenatal classes are available. Begin classes when they are offered.  · Do not use hot tubs, steam rooms, or saunas.  · Wear your seat belt when driving. This protects you and your baby if you are in an accident.  · Avoid raw meat, uncooked cheese, cat litter boxes, and soil used by cats throughout the pregnancy. These carry germs that can cause birth defects in the baby.  · The first trimester is a good time to visit your dentist for your dental health. Getting your teeth cleaned is okay. Use a softer toothbrush and brush gently during pregnancy.  · Ask for help if you have financial, counseling, or nutritional needs  during pregnancy. Your caregiver will be able to offer counseling for these needs as well as refer you for other special needs.  · Do not take any medicines or herbs unless told by your caregiver.  · Inform your caregiver if there is any mental or physical domestic violence.  · Make a list of emergency phone numbers of family, friends, hospital, and police and fire departments.  · Write down your questions. Take them to your prenatal visit.  · Do not douche.  · Do not cross your legs.  · If you have to stand for long periods of time, rotate you feet or take small steps in a circle.  · You may have more vaginal secretions that may require a sanitary pad. Do not use tampons or scented sanitary pads.  MEDICINES AND DRUG USE IN PREGNANCY  ·   Take prenatal vitamins as directed. The vitamin should contain 1 milligram of folic acid. Keep all vitamins out of reach of children. Only a couple vitamins or tablets containing iron may be fatal to a baby or young child when ingested.  · Avoid use of all medicines, including herbs, over-the-counter medicines, not prescribed or suggested by your caregiver. Only take over-the-counter or prescription medicines for pain, discomfort, or fever as directed by your caregiver. Do not use aspirin, ibuprofen, or naproxen unless directed by your caregiver.  · Let your caregiver also know about herbs you may be using.  · Alcohol is related to a number of birth defects. This includes fetal alcohol syndrome. All alcohol, in any form, should be avoided completely. Smoking will cause low birth rate and premature babies.  · Street or illegal drugs are very harmful to the baby. They are absolutely forbidden. A baby born to an addicted mother will be addicted at birth. The baby will go through the same withdrawal an adult does.  · Let your caregiver know about any medicines that you have to take and for what reason you take them.  SEEK MEDICAL CARE IF:   You have any concerns or worries during your  pregnancy. It is better to call with your questions if you feel they cannot wait, rather than worry about them.  SEEK IMMEDIATE MEDICAL CARE IF:   · An unexplained oral temperature above 102° F (38.9° C) develops, or as your caregiver suggests.  · You have leaking of fluid from the vagina (birth canal). If leaking membranes are suspected, take your temperature and inform your caregiver of this when you call.  · There is vaginal spotting or bleeding. Notify your caregiver of the amount and how many pads are used.  · You develop a bad smelling vaginal discharge with a change in the color.  · You continue to feel sick to your stomach (nauseated) and have no relief from remedies suggested. You vomit blood or coffee ground-like materials.  · You lose more than 2 pounds of weight in 1 week.  · You gain more than 2 pounds of weight in 1 week and you notice swelling of your face, hands, feet, or legs.  · You gain 5 pounds or more in 1 week (even if you do not have swelling of your hands, face, legs, or feet).  · You get exposed to German measles and have never had them.  · You are exposed to fifth disease or chickenpox.  · You develop belly (abdominal) pain. Round ligament discomfort is a common non-cancerous (benign) cause of abdominal pain in pregnancy. Your caregiver still must evaluate this.  · You develop headache, fever, diarrhea, pain with urination, or shortness of breath.  · You fall or are in a car accident or have any kind of trauma.  · There is mental or physical violence in your home.  Document Released: 10/28/2001 Document Revised: 07/28/2012 Document Reviewed: 05/01/2009  Exit[employer]® Patient Information ©2014 [employer].

## 2014-01-28 LAB — CULTURE, OB URINE: Colony Count: 15000

## 2014-02-24 ENCOUNTER — Ambulatory Visit (INDEPENDENT_AMBULATORY_CARE_PROVIDER_SITE_OTHER): Payer: BC Managed Care – PPO | Admitting: Advanced Practice Midwife

## 2014-02-24 VITALS — BP 100/57 | Wt 126.0 lb

## 2014-02-24 DIAGNOSIS — Z8742 Personal history of other diseases of the female genital tract: Secondary | ICD-10-CM

## 2014-02-24 DIAGNOSIS — Z8759 Personal history of other complications of pregnancy, childbirth and the puerperium: Secondary | ICD-10-CM

## 2014-02-24 DIAGNOSIS — Z3480 Encounter for supervision of other normal pregnancy, unspecified trimester: Secondary | ICD-10-CM

## 2014-02-24 DIAGNOSIS — Z8632 Personal history of gestational diabetes: Secondary | ICD-10-CM

## 2014-02-24 NOTE — Progress Notes (Signed)
Doing well.  Denies vaginal bleeding, LOF, cramping/contractions.  Reports vaginal discomfort "feels raw" when wiping.  Denies discharge, itching, or burning.  Changing soaps to milder soap and recommend oatmeal bath as needed.

## 2014-02-24 NOTE — Progress Notes (Signed)
P-84  

## 2014-02-25 LAB — GLUCOSE TOLERANCE, 1 HOUR (50G) W/O FASTING: Glucose, 1 Hour GTT: 74 mg/dL (ref 70–140)

## 2014-03-11 ENCOUNTER — Encounter: Payer: Self-pay | Admitting: Advanced Practice Midwife

## 2014-03-22 ENCOUNTER — Ambulatory Visit (INDEPENDENT_AMBULATORY_CARE_PROVIDER_SITE_OTHER): Payer: BC Managed Care – PPO | Admitting: Obstetrics & Gynecology

## 2014-03-22 ENCOUNTER — Encounter: Payer: Self-pay | Admitting: Obstetrics & Gynecology

## 2014-03-22 VITALS — BP 93/61 | HR 79 | Wt 131.0 lb

## 2014-03-22 DIAGNOSIS — R319 Hematuria, unspecified: Secondary | ICD-10-CM

## 2014-03-22 DIAGNOSIS — O09299 Supervision of pregnancy with other poor reproductive or obstetric history, unspecified trimester: Secondary | ICD-10-CM

## 2014-03-22 DIAGNOSIS — Z8632 Personal history of gestational diabetes: Principal | ICD-10-CM

## 2014-03-22 NOTE — Progress Notes (Signed)
Routine visit. No problems. She declines a quad screen. Her anatomy u/s is scheduled for [redacted] weeks EGA. She is feeling some suprapubic pressure so I am ordering a uc&s.

## 2014-03-22 NOTE — Progress Notes (Signed)
Pt urine dip shows large blood chronically

## 2014-03-23 LAB — CULTURE, OB URINE
COLONY COUNT: NO GROWTH
ORGANISM ID, BACTERIA: NO GROWTH

## 2014-04-11 ENCOUNTER — Ambulatory Visit (HOSPITAL_COMMUNITY)
Admission: RE | Admit: 2014-04-11 | Discharge: 2014-04-11 | Disposition: A | Discharge status: Home / Self Care | Payer: BC Managed Care – PPO | Source: Ambulatory Visit | Attending: Advanced Practice Midwife | Admitting: Advanced Practice Midwife

## 2014-04-11 ENCOUNTER — Other Ambulatory Visit: Payer: Self-pay | Admitting: Advanced Practice Midwife

## 2014-04-11 DIAGNOSIS — Z8742 Personal history of other diseases of the female genital tract: Secondary | ICD-10-CM | POA: Insufficient documentation

## 2014-04-11 DIAGNOSIS — Z8632 Personal history of gestational diabetes: Secondary | ICD-10-CM | POA: Insufficient documentation

## 2014-04-11 DIAGNOSIS — Z3689 Encounter for other specified antenatal screening: Secondary | ICD-10-CM | POA: Insufficient documentation

## 2014-04-11 DIAGNOSIS — Z8759 Personal history of other complications of pregnancy, childbirth and the puerperium: Secondary | ICD-10-CM

## 2014-04-19 ENCOUNTER — Ambulatory Visit (INDEPENDENT_AMBULATORY_CARE_PROVIDER_SITE_OTHER): Payer: BC Managed Care – PPO | Admitting: Obstetrics & Gynecology

## 2014-04-19 VITALS — BP 104/61 | HR 81 | Wt 137.0 lb

## 2014-04-19 DIAGNOSIS — Z8632 Personal history of gestational diabetes: Principal | ICD-10-CM

## 2014-04-19 DIAGNOSIS — O4440 Low lying placenta NOS or without hemorrhage, unspecified trimester: Secondary | ICD-10-CM

## 2014-04-19 DIAGNOSIS — O4410 Placenta previa with hemorrhage, unspecified trimester: Secondary | ICD-10-CM

## 2014-04-19 DIAGNOSIS — L293 Anogenital pruritus, unspecified: Secondary | ICD-10-CM

## 2014-04-19 DIAGNOSIS — N898 Other specified noninflammatory disorders of vagina: Secondary | ICD-10-CM

## 2014-04-19 DIAGNOSIS — Z113 Encounter for screening for infections with a predominantly sexual mode of transmission: Secondary | ICD-10-CM

## 2014-04-19 DIAGNOSIS — O09299 Supervision of pregnancy with other poor reproductive or obstetric history, unspecified trimester: Secondary | ICD-10-CM

## 2014-04-19 MED ORDER — FLUCONAZOLE 150 MG PO TABS: 150.0000 mg | ORAL_TABLET | Freq: Once | ORAL | Status: DC

## 2014-04-19 MED ORDER — CLOTRIMAZOLE-BETAMETHASONE 1-0.05 % EX CREA: 1.0000 "application " | TOPICAL_CREAM | Freq: Every day | CUTANEOUS | Status: DC

## 2014-04-19 NOTE — Progress Notes (Signed)
Vaginal itching and burning when she wipes      Urine blood-large

## 2014-04-19 NOTE — Progress Notes (Signed)
Pt c/o or urinary frequency and burning.  Pt also c/o burning on the outside of vulva. Urine culture.   BD affirm.  Vulva is read and d/c looks like yeast.  Will treat with lotrisone and diflucan.   RN clark will call with results.

## 2014-04-20 ENCOUNTER — Other Ambulatory Visit: Payer: Self-pay | Admitting: Obstetrics & Gynecology

## 2014-04-20 ENCOUNTER — Telehealth: Payer: Self-pay | Admitting: *Deleted

## 2014-04-20 MED ORDER — METRONIDAZOLE 500 MG PO TABS: 500.0000 mg | ORAL_TABLET | Freq: Two times a day (BID) | ORAL | Status: DC

## 2014-04-20 NOTE — Telephone Encounter (Signed)
Pt notified of positive BV and Gardnerella and appropriate RX sent to pharmacy.

## 2014-05-18 ENCOUNTER — Ambulatory Visit (INDEPENDENT_AMBULATORY_CARE_PROVIDER_SITE_OTHER): Payer: BC Managed Care – PPO | Admitting: Obstetrics & Gynecology

## 2014-05-18 VITALS — BP 102/59 | HR 86 | Wt 142.0 lb

## 2014-05-18 DIAGNOSIS — Z3480 Encounter for supervision of other normal pregnancy, unspecified trimester: Secondary | ICD-10-CM

## 2014-05-18 DIAGNOSIS — O09292 Supervision of pregnancy with other poor reproductive or obstetric history, second trimester: Secondary | ICD-10-CM

## 2014-05-18 DIAGNOSIS — Z8632 Personal history of gestational diabetes: Principal | ICD-10-CM

## 2014-05-18 NOTE — Patient Instructions (Signed)
Return to clinic for any obstetric concerns or go to [facility]U for evaluation  

## 2014-05-18 NOTE — Progress Notes (Signed)
No other complaints or concerns.  Fetal movement and labor precautions reviewed. Third trimester labs, Tdap next visit.

## 2014-05-29 ENCOUNTER — Other Ambulatory Visit: Payer: BC Managed Care – PPO

## 2014-05-29 DIAGNOSIS — N390 Urinary tract infection, site not specified: Secondary | ICD-10-CM

## 2014-05-29 DIAGNOSIS — R319 Hematuria, unspecified: Secondary | ICD-10-CM

## 2014-05-29 MED ORDER — CEPHALEXIN 500 MG PO CAPS: 500.0000 mg | ORAL_CAPSULE | Freq: Three times a day (TID) | ORAL | Status: DC

## 2014-05-29 NOTE — Progress Notes (Signed)
Pt called stating that she is having increased urinary frequency and urinary burning.  Urine culture sent [facility]

## 2014-05-30 LAB — CULTURE, URINE COMPREHENSIVE
Colony Count: NO GROWTH
ORGANISM ID, BACTERIA: NO GROWTH

## 2014-05-31 ENCOUNTER — Telehealth: Payer: Self-pay | Admitting: *Deleted

## 2014-05-31 NOTE — Telephone Encounter (Signed)
Lm on voicemail of neg UA culture and does not need to continue ATB.

## 2014-06-15 ENCOUNTER — Ambulatory Visit (HOSPITAL_COMMUNITY)
Admission: RE | Admit: 2014-06-15 | Discharge: 2014-06-15 | Disposition: A | Discharge status: Home / Self Care | Payer: BC Managed Care – PPO | Source: Ambulatory Visit | Attending: Obstetrics & Gynecology | Admitting: Obstetrics & Gynecology

## 2014-06-15 ENCOUNTER — Encounter: Payer: Self-pay | Admitting: Obstetrics & Gynecology

## 2014-06-15 DIAGNOSIS — O4440 Low lying placenta NOS or without hemorrhage, unspecified trimester: Secondary | ICD-10-CM

## 2014-06-15 DIAGNOSIS — Z3689 Encounter for other specified antenatal screening: Secondary | ICD-10-CM | POA: Insufficient documentation

## 2014-06-15 DIAGNOSIS — O4410 Placenta previa with hemorrhage, unspecified trimester: Secondary | ICD-10-CM | POA: Insufficient documentation

## 2014-06-16 ENCOUNTER — Ambulatory Visit (INDEPENDENT_AMBULATORY_CARE_PROVIDER_SITE_OTHER): Payer: BC Managed Care – PPO | Admitting: Advanced Practice Midwife

## 2014-06-16 VITALS — BP 107/63 | HR 87 | Wt 147.0 lb

## 2014-06-16 DIAGNOSIS — O09292 Supervision of pregnancy with other poor reproductive or obstetric history, second trimester: Secondary | ICD-10-CM

## 2014-06-16 DIAGNOSIS — O09299 Supervision of pregnancy with other poor reproductive or obstetric history, unspecified trimester: Secondary | ICD-10-CM

## 2014-06-16 DIAGNOSIS — Z3480 Encounter for supervision of other normal pregnancy, unspecified trimester: Secondary | ICD-10-CM

## 2014-06-16 DIAGNOSIS — Z3482 Encounter for supervision of other normal pregnancy, second trimester: Secondary | ICD-10-CM | POA: Insufficient documentation

## 2014-06-16 DIAGNOSIS — Z8632 Personal history of gestational diabetes: Secondary | ICD-10-CM

## 2014-06-16 NOTE — Progress Notes (Signed)
Pt states she has "muscle protruding" on right lower abd by the end of the day and causes pain. UA shows Large blood

## 2014-06-16 NOTE — Progress Notes (Signed)
?   inguinal hernia vs varicosity in right inguinal area. Hernia precautions. Encouraged TDap. Deferred until PP. 1 hour GTT. Some increased pelvic pressure, but CL 3.69 cm yesterday. Declines VE.

## 2014-06-17 LAB — CBC
HCT: 34.7 % — ABNORMAL LOW (ref 36.0–46.0)
HEMOGLOBIN: 11.9 g/dL — AB (ref 12.0–15.0)
MCH: 32.7 pg (ref 26.0–34.0)
MCHC: 34.3 g/dL (ref 30.0–36.0)
MCV: 95.3 fL (ref 78.0–100.0)
Platelets: 188 10*3/uL (ref 150–400)
RBC: 3.64 MIL/uL — ABNORMAL LOW (ref 3.87–5.11)
RDW: 13.6 % (ref 11.5–15.5)
WBC: 7.5 10*3/uL (ref 4.0–10.5)

## 2014-06-17 LAB — HIV ANTIBODY (ROUTINE TESTING W REFLEX): HIV: NONREACTIVE

## 2014-06-17 LAB — RPR

## 2014-06-17 LAB — GLUCOSE TOLERANCE, 1 HOUR (50G) W/O FASTING: Glucose, 1 Hour GTT: 67 mg/dL — ABNORMAL LOW (ref 70–140)

## 2014-06-19 ENCOUNTER — Telehealth: Payer: Self-pay | Admitting: *Deleted

## 2014-06-19 NOTE — Telephone Encounter (Signed)
Pt notified of normal 28 wk labs

## 2014-06-20 ENCOUNTER — Encounter: Payer: Self-pay | Admitting: Family

## 2014-06-30 ENCOUNTER — Encounter: Payer: Self-pay | Admitting: Obstetrics and Gynecology

## 2014-06-30 ENCOUNTER — Ambulatory Visit (INDEPENDENT_AMBULATORY_CARE_PROVIDER_SITE_OTHER): Payer: BC Managed Care – PPO | Admitting: Obstetrics and Gynecology

## 2014-06-30 VITALS — BP 96/57 | HR 85 | Wt 147.0 lb

## 2014-06-30 DIAGNOSIS — Z3480 Encounter for supervision of other normal pregnancy, unspecified trimester: Secondary | ICD-10-CM

## 2014-06-30 DIAGNOSIS — O09523 Supervision of elderly multigravida, third trimester: Secondary | ICD-10-CM

## 2014-06-30 DIAGNOSIS — O09529 Supervision of elderly multigravida, unspecified trimester: Secondary | ICD-10-CM

## 2014-06-30 NOTE — Patient Instructions (Signed)
Third Trimester of Pregnancy The third trimester is from week 29 through week 42, months 7 through 9. The third trimester is a time when the fetus is growing rapidly. At the end of the ninth month, the fetus is about 20 inches in length and weighs 6-10 pounds.  BODY CHANGES Your body goes through many changes during pregnancy. The changes vary from woman to woman.   Your weight will continue to increase. You can expect to gain 25-35 pounds (11-16 kg) by the end of the pregnancy.  You may begin to get stretch marks on your hips, abdomen, and breasts.  You may urinate more often because the fetus is moving lower into your pelvis and pressing on your bladder.  You may develop or continue to have heartburn as a result of your pregnancy.  You may develop constipation because certain hormones are causing the muscles that push waste through your intestines to slow down.  You may develop hemorrhoids or swollen, bulging veins (varicose veins).  You may have pelvic pain because of the weight gain and pregnancy hormones relaxing your joints between the bones in your pelvis. Backaches may result from overexertion of the muscles supporting your posture.  You may have changes in your hair. These can include thickening of your hair, rapid growth, and changes in texture. Some women also have hair loss during or after pregnancy, or hair that feels dry or thin. Your hair will most likely return to normal after your baby is born.  Your breasts will continue to grow and be tender. A yellow discharge may leak from your breasts called colostrum.  Your belly button may stick out.  You may feel short of breath because of your expanding uterus.  You may notice the fetus "dropping," or moving lower in your abdomen.  You may have a bloody mucus discharge. This usually occurs a few days to a week before labor begins.  Your cervix becomes thin and soft (effaced) near your due date. WHAT TO EXPECT AT YOUR PRENATAL  EXAMS  You will have prenatal exams every 2 weeks until week 36. Then, you will have weekly prenatal exams. During a routine prenatal visit:  You will be weighed to make sure you and the fetus are growing normally.  Your blood pressure is taken.  Your abdomen will be measured to track your baby's growth.  The fetal heartbeat will be listened to.  Any test results from the previous visit will be discussed.  You may have a cervical check near your due date to see if you have effaced. At around 36 weeks, your caregiver will check your cervix. At the same time, your caregiver will also perform a test on the secretions of the vaginal tissue. This test is to determine if a type of bacteria, Group B streptococcus, is present. Your caregiver will explain this further. Your caregiver may ask you:  What your birth plan is.  How you are feeling.  If you are feeling the baby move.  If you have had any abnormal symptoms, such as leaking fluid, bleeding, severe headaches, or abdominal cramping.  If you have any questions. Other tests or screenings that may be performed during your third trimester include:  Blood tests that check for low iron levels (anemia).  Fetal testing to check the health, activity level, and growth of the fetus. Testing is done if you have certain medical conditions or if there are problems during the pregnancy. FALSE LABOR You may feel small, irregular contractions that   eventually go away. These are called Braxton Hicks contractions, or false labor. Contractions may last for hours, days, or even weeks before true labor sets in. If contractions come at regular intervals, intensify, or become painful, it is best to be seen by your caregiver.  SIGNS OF LABOR   Menstrual-like cramps.  Contractions that are 5 minutes apart or less.  Contractions that start on the top of the uterus and spread down to the lower abdomen and back.  A sense of increased pelvic pressure or back  pain.  A watery or bloody mucus discharge that comes from the vagina. If you have any of these signs before the 37th week of pregnancy, call your caregiver right away. You need to go to the hospital to get checked immediately. HOME CARE INSTRUCTIONS   Avoid all smoking, herbs, alcohol, and unprescribed drugs. These chemicals affect the formation and growth of the baby.  Follow your caregiver's instructions regarding medicine use. There are medicines that are either safe or unsafe to take during pregnancy.  Exercise only as directed by your caregiver. Experiencing uterine cramps is a good sign to stop exercising.  Continue to eat regular, healthy meals.  Wear a good support bra for breast tenderness.  Do not use hot tubs, steam rooms, or saunas.  Wear your seat belt at all times when driving.  Avoid raw meat, uncooked cheese, cat litter boxes, and soil used by cats. These carry germs that can cause birth defects in the baby.  Take your prenatal vitamins.  Try taking a stool softener (if your caregiver approves) if you develop constipation. Eat more high-fiber foods, such as fresh vegetables or fruit and whole grains. Drink plenty of fluids to keep your urine clear or pale yellow.  Take warm sitz baths to soothe any pain or discomfort caused by hemorrhoids. Use hemorrhoid cream if your caregiver approves.  If you develop varicose veins, wear support hose. Elevate your feet for 15 minutes, 3-4 times a day. Limit salt in your diet.  Avoid heavy lifting, wear low heal shoes, and practice good posture.  Rest a lot with your legs elevated if you have leg cramps or low back pain.  Visit your dentist if you have not gone during your pregnancy. Use a soft toothbrush to brush your teeth and be gentle when you floss.  A sexual relationship may be continued unless your caregiver directs you otherwise.  Do not travel far distances unless it is absolutely necessary and only with the approval  of your caregiver.  Take prenatal classes to understand, practice, and ask questions about the labor and delivery.  Make a trial run to the hospital.  Pack your hospital bag.  Prepare the baby's nursery.  Continue to go to all your prenatal visits as directed by your caregiver. SEEK MEDICAL CARE IF:  You are unsure if you are in labor or if your water has broken.  You have dizziness.  You have mild pelvic cramps, pelvic pressure, or nagging pain in your abdominal area.  You have persistent nausea, vomiting, or diarrhea.  You have a bad smelling vaginal discharge.  You have pain with urination. SEEK IMMEDIATE MEDICAL CARE IF:   You have a fever.  You are leaking fluid from your vagina.  You have spotting or bleeding from your vagina.  You have severe abdominal cramping or pain.  You have rapid weight loss or gain.  You have shortness of breath with chest pain.  You notice sudden or extreme swelling   of your face, hands, ankles, feet, or legs.  You have not felt your baby move in over an hour.  You have severe headaches that do not go away with medicine.  You have vision changes. Document Released: 10/28/2001 Document Revised: 11/08/2013 Document Reviewed: 01/04/2013 [employer] Patient Information 2015 [employer]. This information is not intended to replace advice given to you by your health care provider. Make sure you discuss any questions you have with your health care provider.  

## 2014-06-30 NOTE — Progress Notes (Signed)
Doing well. Will do waterbirth class and decide about that. [facility]  LBP relief measures. Mild left groin discomfort.

## 2014-07-21 ENCOUNTER — Ambulatory Visit (INDEPENDENT_AMBULATORY_CARE_PROVIDER_SITE_OTHER): Payer: BC Managed Care – PPO | Admitting: Advanced Practice Midwife

## 2014-07-21 VITALS — BP 106/66 | HR 80 | Wt 152.0 lb

## 2014-07-21 DIAGNOSIS — R300 Dysuria: Secondary | ICD-10-CM

## 2014-07-21 DIAGNOSIS — Z3483 Encounter for supervision of other normal pregnancy, third trimester: Secondary | ICD-10-CM

## 2014-07-21 DIAGNOSIS — O26893 Other specified pregnancy related conditions, third trimester: Secondary | ICD-10-CM

## 2014-07-21 DIAGNOSIS — Z3480 Encounter for supervision of other normal pregnancy, unspecified trimester: Secondary | ICD-10-CM | POA: Diagnosis not present

## 2014-07-21 DIAGNOSIS — O9989 Other specified diseases and conditions complicating pregnancy, childbirth and the puerperium: Secondary | ICD-10-CM

## 2014-07-21 NOTE — Patient Instructions (Signed)
AZO/Uristat (200 mg three x per day as needed for bladder pain.)  Dysuria Dysuria is the medical term for pain with urination. There are many causes for dysuria, but urinary tract infection is the most common. If a urinalysis was performed it can show that there is a urinary tract infection. A urine culture confirms that you or your child is sick. You will need to follow up with a healthcare provider because:  If a urine culture was done you will need to know the culture results and treatment recommendations.  If the urine culture was positive, you or your child will need to be put on antibiotics or know if the antibiotics prescribed are the right antibiotics for your urinary tract infection.  If the urine culture is negative (no urinary tract infection), then other causes may need to be explored or antibiotics need to be stopped. Today laboratory work may have been done and there does not seem to be an infection. If cultures were done they will take at least 24 to 48 hours to be completed. Today x-rays may have been taken and they read as normal. No cause can be found for the problems. The x-rays may be re-read by a radiologist and you will be contacted if additional findings are made. You or your child may have been put on medications to help with this problem until you can see your primary caregiver. If the problems get better, see your primary caregiver if the problems return. If you were given antibiotics (medications which kill germs), take all of the mediations as directed for the full course of treatment.  If laboratory work was done, you need to find the results. Leave a telephone number where you can be reached. If this is not possible, make sure you find out how you are to get test results. HOME CARE INSTRUCTIONS   Drink lots of fluids. For adults, drink eight, 8 ounce glasses of clear juice or water a day. For children, replace fluids as suggested by your caregiver.  Empty the bladder  often. Avoid holding urine for long periods of time.  After a bowel movement, women should cleanse front to back, using each tissue only once.  Empty your bladder before and after sexual intercourse.  Take all the medicine given to you until it is gone. You may feel better in a few days, but TAKE ALL MEDICINE.  Avoid caffeine, tea, alcohol and carbonated beverages, because they tend to irritate the bladder.  In men, alcohol may irritate the prostate.  Only take over-the-counter or prescription medicines for pain, discomfort, or fever as directed by your caregiver.  If your caregiver has given you a follow-up appointment, it is very important to keep that appointment. Not keeping the appointment could result in a chronic or permanent injury, pain, and disability. If there is any problem keeping the appointment, you must call back to this facility for assistance. SEEK IMMEDIATE MEDICAL CARE IF:   Back pain develops.  A fever develops.  There is nausea (feeling sick to your stomach) or vomiting (throwing up).  Problems are no better with medications or are getting worse. MAKE SURE YOU:   Understand these instructions.  Will watch your condition.  Will get help right away if you are not doing well or get worse. Document Released: 08/01/2004 Document Revised: 01/26/2012 Document Reviewed: 06/08/2008 [facility] Patient Information 2015 [place], [place]. This information is not intended to replace advice given to you by your health care provider. Make sure you discuss  any questions you have with your health care provider.  Third Trimester of Pregnancy The third trimester is from week 29 through week 42, months 7 through 9. The third trimester is a time when the fetus is growing rapidly. At the end of the ninth month, the fetus is about 20 inches in length and weighs 6-10 pounds.  BODY CHANGES Your body goes through many changes during pregnancy. The changes vary from woman to woman.    Your weight will continue to increase. You can expect to gain 25-35 pounds (11-16 kg) by the end of the pregnancy.  You may begin to get stretch marks on your hips, abdomen, and breasts.  You may urinate more often because the fetus is moving lower into your pelvis and pressing on your bladder.  You may develop or continue to have heartburn as a result of your pregnancy.  You may develop constipation because certain hormones are causing the muscles that push waste through your intestines to slow down.  You may develop hemorrhoids or swollen, bulging veins (varicose veins).  You may have pelvic pain because of the weight gain and pregnancy hormones relaxing your joints between the bones in your pelvis. Backaches may result from overexertion of the muscles supporting your posture.  You may have changes in your hair. These can include thickening of your hair, rapid growth, and changes in texture. Some women also have hair loss during or after pregnancy, or hair that feels dry or thin. Your hair will most likely return to normal after your baby is born.  Your breasts will continue to grow and be tender. A yellow discharge may leak from your breasts called colostrum.  Your belly button may stick out.  You may feel short of breath because of your expanding uterus.  You may notice the fetus "dropping," or moving lower in your abdomen.  You may have a bloody mucus discharge. This usually occurs a few days to a week before labor begins.  Your cervix becomes thin and soft (effaced) near your due date. WHAT TO EXPECT AT YOUR PRENATAL EXAMS  You will have prenatal exams every 2 weeks until week 36. Then, you will have weekly prenatal exams. During a routine prenatal visit:  You will be weighed to make sure you and the fetus are growing normally.  Your blood pressure is taken.  Your abdomen will be measured to track your baby's growth.  The fetal heartbeat will be listened to.  Any test  results from the previous visit will be discussed.  You may have a cervical check near your due date to see if you have effaced. At around 36 weeks, your caregiver will check your cervix. At the same time, your caregiver will also perform a test on the secretions of the vaginal tissue. This test is to determine if a type of bacteria, Group B streptococcus, is present. Your caregiver will explain this further. Your caregiver may ask you:  What your birth plan is.  How you are feeling.  If you are feeling the baby move.  If you have had any abnormal symptoms, such as leaking fluid, bleeding, severe headaches, or abdominal cramping.  If you have any questions. Other tests or screenings that may be performed during your third trimester include:  Blood tests that check for low iron levels (anemia).  Fetal testing to check the health, activity level, and growth of the fetus. Testing is done if you have certain medical conditions or if there are problems during  the pregnancy. FALSE LABOR You may feel small, irregular contractions that eventually go away. These are called Braxton Hicks contractions, or false labor. Contractions may last for hours, days, or even weeks before true labor sets in. If contractions come at regular intervals, intensify, or become painful, it is best to be seen by your caregiver.  SIGNS OF LABOR   Menstrual-like cramps.  Contractions that are 5 minutes apart or less.  Contractions that start on the top of the uterus and spread down to the lower abdomen and back.  A sense of increased pelvic pressure or back pain.  A watery or bloody mucus discharge that comes from the vagina. If you have any of these signs before the 37th week of pregnancy, call your caregiver right away. You need to go to the hospital to get checked immediately. HOME CARE INSTRUCTIONS   Avoid all smoking, herbs, alcohol, and unprescribed drugs. These chemicals affect the formation and growth of  the baby.  Follow your caregiver's instructions regarding medicine use. There are medicines that are either safe or unsafe to take during pregnancy.  Exercise only as directed by your caregiver. Experiencing uterine cramps is a good sign to stop exercising.  Continue to eat regular, healthy meals.  Wear a good support bra for breast tenderness.  Do not use hot tubs, steam rooms, or saunas.  Wear your seat belt at all times when driving.  Avoid raw meat, uncooked cheese, cat litter boxes, and soil used by cats. These carry germs that can cause birth defects in the baby.  Take your prenatal vitamins.  Try taking a stool softener (if your caregiver approves) if you develop constipation. Eat more high-fiber foods, such as fresh vegetables or fruit and whole grains. Drink plenty of fluids to keep your urine clear or pale yellow.  Take warm sitz baths to soothe any pain or discomfort caused by hemorrhoids. Use hemorrhoid cream if your caregiver approves.  If you develop varicose veins, wear support hose. Elevate your feet for 15 minutes, 3-4 times a day. Limit salt in your diet.  Avoid heavy lifting, wear low heal shoes, and practice good posture.  Rest a lot with your legs elevated if you have leg cramps or low back pain.  Visit your dentist if you have not gone during your pregnancy. Use a soft toothbrush to brush your teeth and be gentle when you floss.  A sexual relationship may be continued unless your caregiver directs you otherwise.  Do not travel far distances unless it is absolutely necessary and only with the approval of your caregiver.  Take prenatal classes to understand, practice, and ask questions about the labor and delivery.  Make a trial run to the hospital.  Pack your hospital bag.  Prepare the baby's nursery.  Continue to go to all your prenatal visits as directed by your caregiver. SEEK MEDICAL CARE IF:  You are unsure if you are in labor or if your water  has broken.  You have dizziness.  You have mild pelvic cramps, pelvic pressure, or nagging pain in your abdominal area.  You have persistent nausea, vomiting, or diarrhea.  You have a bad smelling vaginal discharge.  You have pain with urination. SEEK IMMEDIATE MEDICAL CARE IF:   You have a fever.  You are leaking fluid from your vagina.  You have spotting or bleeding from your vagina.  You have severe abdominal cramping or pain.  You have rapid weight loss or gain.  You have shortness of  breath with chest pain.  You notice sudden or extreme swelling of your face, hands, ankles, feet, or legs.  You have not felt your baby move in over an hour.  You have severe headaches that do not go away with medicine.  You have vision changes. Document Released: 10/28/2001 Document Revised: 11/08/2013 Document Reviewed: 01/04/2013 [facility] Patient Information 2015 [place], [place]. This information is not intended to replace advice given to you by your health care provider. Make sure you discuss any questions you have with your health care provider.

## 2014-07-21 NOTE — Progress Notes (Signed)
Reviewed weight goals.

## 2014-07-22 LAB — CULTURE, OB URINE: Colony Count: 8000

## 2014-08-02 ENCOUNTER — Telehealth: Payer: Self-pay | Admitting: *Deleted

## 2014-08-02 DIAGNOSIS — N390 Urinary tract infection, site not specified: Secondary | ICD-10-CM

## 2014-08-02 DIAGNOSIS — G4700 Insomnia, unspecified: Secondary | ICD-10-CM

## 2014-08-02 MED ORDER — ZOLPIDEM TARTRATE 5 MG PO TABS: 5.0000 mg | ORAL_TABLET | Freq: Every evening | ORAL | Status: DC | PRN

## 2014-08-02 MED ORDER — NITROFURANTOIN MONOHYD MACRO 100 MG PO CAPS: 100.0000 mg | ORAL_CAPSULE | Freq: Two times a day (BID) | ORAL | Status: DC

## 2014-08-02 MED ORDER — FLUCONAZOLE 150 MG PO TABS: 150.0000 mg | ORAL_TABLET | Freq: Once | ORAL | Status: DC

## 2014-08-02 NOTE — Telephone Encounter (Signed)
Sherry Page called in with a couple of questions.   1) She has trouble staying asleep and had been prescribed Ambien in the past during pregnancy. She has tried OTC sleep aids but they only help with falling asleep and that is not the problem. She can go to sleep but can't stay asleep. She would like to know if we can prescribe that for her.   2) She c/o urinary pain, urgency, and burning. She was tested for UTI last week with culture showing 8,000 colonies - insignificant growth. She wanted to know if she can take AZO. If she can, how much and how long can she take it. The AZO she has is 99.5mg  with directions stating  per day with no more than 2 days use. Should abx be called in? If abx is started she requested Diflucan as she is prone to yeast infections from abx use.    Spoke to Dr. Macon Large who gave verbal order for  Ambien qhs prn, macrobid and diflucan. I called pt back and LMOM for her to pick up meds at pharmacy.

## 2014-08-09 ENCOUNTER — Encounter: Payer: Self-pay | Admitting: *Deleted

## 2014-08-09 ENCOUNTER — Ambulatory Visit (INDEPENDENT_AMBULATORY_CARE_PROVIDER_SITE_OTHER): Payer: BC Managed Care – PPO | Admitting: Obstetrics & Gynecology

## 2014-08-09 VITALS — BP 122/79 | HR 98 | Wt 155.0 lb

## 2014-08-09 DIAGNOSIS — Z3483 Encounter for supervision of other normal pregnancy, third trimester: Secondary | ICD-10-CM

## 2014-08-09 DIAGNOSIS — Z3480 Encounter for supervision of other normal pregnancy, unspecified trimester: Secondary | ICD-10-CM

## 2014-08-09 DIAGNOSIS — Z3685 Encounter for antenatal screening for Streptococcus B: Secondary | ICD-10-CM

## 2014-08-09 DIAGNOSIS — O09299 Supervision of pregnancy with other poor reproductive or obstetric history, unspecified trimester: Secondary | ICD-10-CM

## 2014-08-09 DIAGNOSIS — Z8632 Personal history of gestational diabetes: Secondary | ICD-10-CM

## 2014-08-09 DIAGNOSIS — O09293 Supervision of pregnancy with other poor reproductive or obstetric history, third trimester: Secondary | ICD-10-CM

## 2014-08-09 DIAGNOSIS — Z113 Encounter for screening for infections with a predominantly sexual mode of transmission: Secondary | ICD-10-CM

## 2014-08-09 LAB — OB RESULTS CONSOLE GC/CHLAMYDIA
Chlamydia: NEGATIVE
Gonorrhea: NEGATIVE

## 2014-08-09 NOTE — Progress Notes (Signed)
Watch fundal height.  If 3 cm behind then get [place] for growth.   Still has urinary sym[ptoms.  ?Interstitial cystitis.  Will need w/u post partum.

## 2014-08-09 NOTE — Patient Instructions (Signed)

## 2014-08-10 LAB — GC/CHLAMYDIA PROBE AMP
CT Probe RNA: NEGATIVE
GC Probe RNA: NEGATIVE

## 2014-08-11 LAB — CULTURE, BETA STREP (GROUP B ONLY)

## 2014-08-12 ENCOUNTER — Encounter: Payer: Self-pay | Admitting: Obstetrics & Gynecology

## 2014-08-16 ENCOUNTER — Ambulatory Visit (INDEPENDENT_AMBULATORY_CARE_PROVIDER_SITE_OTHER): Payer: BC Managed Care – PPO | Admitting: Obstetrics & Gynecology

## 2014-08-16 VITALS — BP 121/70 | HR 79 | Temp 97.4°F | Wt 158.0 lb

## 2014-08-16 DIAGNOSIS — O09523 Supervision of elderly multigravida, third trimester: Secondary | ICD-10-CM

## 2014-08-16 DIAGNOSIS — O09293 Supervision of pregnancy with other poor reproductive or obstetric history, third trimester: Secondary | ICD-10-CM

## 2014-08-16 DIAGNOSIS — O09529 Supervision of elderly multigravida, unspecified trimester: Secondary | ICD-10-CM

## 2014-08-16 DIAGNOSIS — Z3480 Encounter for supervision of other normal pregnancy, unspecified trimester: Secondary | ICD-10-CM

## 2014-08-16 DIAGNOSIS — Z8632 Personal history of gestational diabetes: Secondary | ICD-10-CM

## 2014-08-16 NOTE — Progress Notes (Signed)
Pt will think about Tdap.  Right now wants the vaccination in the hospital.  Educated rationale about administering the vaccine during pregnancy.  Fundal height is good.  Other mild complaints common with late third trimester.  No signs of preeclampsia.

## 2014-08-16 NOTE — Progress Notes (Signed)
Nausea and headache since Sunday.  Increased in night sweats.  Pt states she lost her mucous plug Sat.

## 2014-08-23 ENCOUNTER — Ambulatory Visit (INDEPENDENT_AMBULATORY_CARE_PROVIDER_SITE_OTHER): Payer: BC Managed Care – PPO | Admitting: Obstetrics & Gynecology

## 2014-08-23 ENCOUNTER — Ambulatory Visit (HOSPITAL_COMMUNITY)
Admission: RE | Admit: 2014-08-23 | Discharge: 2014-08-23 | Disposition: A | Discharge status: Home / Self Care | Payer: BC Managed Care – PPO | Source: Ambulatory Visit | Attending: Obstetrics & Gynecology | Admitting: Obstetrics & Gynecology

## 2014-08-23 VITALS — BP 122/78 | HR 82 | Wt 159.0 lb

## 2014-08-23 DIAGNOSIS — Z8632 Personal history of gestational diabetes: Secondary | ICD-10-CM

## 2014-08-23 DIAGNOSIS — O26843 Uterine size-date discrepancy, third trimester: Secondary | ICD-10-CM | POA: Insufficient documentation

## 2014-08-23 DIAGNOSIS — O09293 Supervision of pregnancy with other poor reproductive or obstetric history, third trimester: Secondary | ICD-10-CM

## 2014-08-23 DIAGNOSIS — Z3A37 37 weeks gestation of pregnancy: Secondary | ICD-10-CM | POA: Insufficient documentation

## 2014-08-23 NOTE — Progress Notes (Signed)
Fundal height S<D.  Will get US for growth and fluid.  Refuses Tdap and flu

## 2014-08-24 ENCOUNTER — Telehealth: Payer: Self-pay | Admitting: *Deleted

## 2014-08-24 NOTE — Telephone Encounter (Signed)
Pt notified of normal U/S and she will keep her ROB next Wed.

## 2014-08-24 NOTE — Telephone Encounter (Signed)
Message copied by Granville LewisLARK, Martine Bleecker L on Thu Aug 24, 2014  9:50 AM ------      Message from: Lesly DukesLEGGETT, KELLY H      Created: Thu Aug 24, 2014  9:47 AM       Call patient with US results.  Baby measures 42% percentile, nml fluid.  All is good.   ------

## 2014-08-30 ENCOUNTER — Ambulatory Visit (INDEPENDENT_AMBULATORY_CARE_PROVIDER_SITE_OTHER): Payer: BC Managed Care – PPO | Admitting: Obstetrics & Gynecology

## 2014-08-30 ENCOUNTER — Encounter: Payer: Self-pay | Admitting: Obstetrics & Gynecology

## 2014-08-30 VITALS — BP 129/78 | HR 90 | Wt 160.0 lb

## 2014-08-30 DIAGNOSIS — Z8632 Personal history of gestational diabetes: Principal | ICD-10-CM

## 2014-08-30 DIAGNOSIS — O09299 Supervision of pregnancy with other poor reproductive or obstetric history, unspecified trimester: Secondary | ICD-10-CM

## 2014-08-30 NOTE — Progress Notes (Signed)
Routine visit. Good FM. No problems. Labor precautions reviewed. 

## 2014-09-06 ENCOUNTER — Ambulatory Visit (INDEPENDENT_AMBULATORY_CARE_PROVIDER_SITE_OTHER): Payer: BC Managed Care – PPO | Admitting: Obstetrics & Gynecology

## 2014-09-06 ENCOUNTER — Encounter: Payer: Self-pay | Admitting: Obstetrics & Gynecology

## 2014-09-06 VITALS — BP 128/82 | HR 90 | Wt 161.0 lb

## 2014-09-06 DIAGNOSIS — O09293 Supervision of pregnancy with other poor reproductive or obstetric history, third trimester: Secondary | ICD-10-CM

## 2014-09-06 DIAGNOSIS — O09523 Supervision of elderly multigravida, third trimester: Secondary | ICD-10-CM | POA: Diagnosis not present

## 2014-09-06 DIAGNOSIS — Z8632 Personal history of gestational diabetes: Secondary | ICD-10-CM

## 2014-09-06 NOTE — Progress Notes (Signed)
Routine visit. Decreased FM this morning. Good all the other days. NST today reviewed and reactive. Labor precautions reviewed.

## 2014-09-11 ENCOUNTER — Ambulatory Visit (INDEPENDENT_AMBULATORY_CARE_PROVIDER_SITE_OTHER): Payer: BC Managed Care – PPO | Admitting: Advanced Practice Midwife

## 2014-09-11 ENCOUNTER — Other Ambulatory Visit: Payer: BC Managed Care – PPO

## 2014-09-11 VITALS — BP 136/82 | HR 101 | Wt 160.0 lb

## 2014-09-11 DIAGNOSIS — O480 Post-term pregnancy: Secondary | ICD-10-CM

## 2014-09-11 DIAGNOSIS — O09523 Supervision of elderly multigravida, third trimester: Secondary | ICD-10-CM

## 2014-09-11 NOTE — Patient Instructions (Signed)

## 2014-09-11 NOTE — Progress Notes (Signed)
NST reactive. Membranes swept per request. Wants to do waterbirth. Wants to wait until next week for IOL if possible. Will re-sweep Thu/Fri if not delivered by then. Another NST then too.

## 2014-09-13 ENCOUNTER — Ambulatory Visit (INDEPENDENT_AMBULATORY_CARE_PROVIDER_SITE_OTHER): Payer: BC Managed Care – PPO | Admitting: Obstetrics & Gynecology

## 2014-09-13 ENCOUNTER — Encounter (HOSPITAL_COMMUNITY): Payer: Self-pay

## 2014-09-13 ENCOUNTER — Inpatient Hospital Stay (HOSPITAL_COMMUNITY)
Admission: AD | Admit: 2014-09-13 | Discharge: 2014-09-15 | DRG: 775 | Disposition: A | Discharge status: Home / Self Care | Payer: BC Managed Care – PPO | Source: Ambulatory Visit | Attending: Obstetrics & Gynecology | Admitting: Obstetrics & Gynecology

## 2014-09-13 VITALS — BP 129/78 | HR 87 | Temp 97.9°F

## 2014-09-13 DIAGNOSIS — Z8249 Family history of ischemic heart disease and other diseases of the circulatory system: Secondary | ICD-10-CM

## 2014-09-13 DIAGNOSIS — O09293 Supervision of pregnancy with other poor reproductive or obstetric history, third trimester: Secondary | ICD-10-CM | POA: Diagnosis not present

## 2014-09-13 DIAGNOSIS — Z3A41 41 weeks gestation of pregnancy: Secondary | ICD-10-CM | POA: Diagnosis present

## 2014-09-13 DIAGNOSIS — Z8632 Personal history of gestational diabetes: Secondary | ICD-10-CM

## 2014-09-13 DIAGNOSIS — Z87891 Personal history of nicotine dependence: Secondary | ICD-10-CM

## 2014-09-13 DIAGNOSIS — Z833 Family history of diabetes mellitus: Secondary | ICD-10-CM

## 2014-09-13 DIAGNOSIS — Z823 Family history of stroke: Secondary | ICD-10-CM

## 2014-09-13 DIAGNOSIS — O09523 Supervision of elderly multigravida, third trimester: Secondary | ICD-10-CM

## 2014-09-13 DIAGNOSIS — IMO0001 Reserved for inherently not codable concepts without codable children: Secondary | ICD-10-CM

## 2014-09-13 LAB — OB RESULTS CONSOLE GBS: STREP GROUP B AG: NEGATIVE

## 2014-09-13 NOTE — Progress Notes (Signed)
Pt is 4/70/-2.  Membranes stripped.  Discussed induction--wants to wait until 42 weeks. R/O for rupture here--only mucous.  Fern, pool, Nitrazine neg.  Needs AFI this week.

## 2014-09-13 NOTE — Progress Notes (Signed)
Contractions last night every 10 min for 3 hours

## 2014-09-13 NOTE — MAU Note (Signed)
In office today. Was 4 cm and had membranes swept. Contractions started regualrly at 7pm and are every 6-7 mins. Denies LOF, vag bleeding. +FM

## 2014-09-14 ENCOUNTER — Encounter (HOSPITAL_COMMUNITY): Payer: Self-pay | Admitting: Obstetrics

## 2014-09-14 DIAGNOSIS — Z8249 Family history of ischemic heart disease and other diseases of the circulatory system: Secondary | ICD-10-CM | POA: Diagnosis not present

## 2014-09-14 DIAGNOSIS — Z3A41 41 weeks gestation of pregnancy: Secondary | ICD-10-CM | POA: Diagnosis present

## 2014-09-14 DIAGNOSIS — O24410 Gestational diabetes mellitus in pregnancy, diet controlled: Secondary | ICD-10-CM

## 2014-09-14 DIAGNOSIS — Z833 Family history of diabetes mellitus: Secondary | ICD-10-CM | POA: Diagnosis not present

## 2014-09-14 DIAGNOSIS — IMO0001 Reserved for inherently not codable concepts without codable children: Secondary | ICD-10-CM

## 2014-09-14 DIAGNOSIS — O09523 Supervision of elderly multigravida, third trimester: Secondary | ICD-10-CM

## 2014-09-14 DIAGNOSIS — Z87891 Personal history of nicotine dependence: Secondary | ICD-10-CM | POA: Diagnosis not present

## 2014-09-14 DIAGNOSIS — Z8632 Personal history of gestational diabetes: Secondary | ICD-10-CM | POA: Diagnosis not present

## 2014-09-14 DIAGNOSIS — Z823 Family history of stroke: Secondary | ICD-10-CM | POA: Diagnosis not present

## 2014-09-14 LAB — CBC
HEMATOCRIT: 38.7 % (ref 36.0–46.0)
HEMOGLOBIN: 13.4 g/dL (ref 12.0–15.0)
MCH: 33.6 pg (ref 26.0–34.0)
MCHC: 34.6 g/dL (ref 30.0–36.0)
MCV: 97 fL (ref 78.0–100.0)
Platelets: 154 10*3/uL (ref 150–400)
RBC: 3.99 MIL/uL (ref 3.87–5.11)
RDW: 12.6 % (ref 11.5–15.5)
WBC: 10.3 10*3/uL (ref 4.0–10.5)

## 2014-09-14 LAB — HIV ANTIBODY (ROUTINE TESTING W REFLEX): HIV 1&2 Ab, 4th Generation: NONREACTIVE

## 2014-09-14 LAB — TYPE AND SCREEN
ABO/RH(D): A POS
Antibody Screen: NEGATIVE

## 2014-09-14 LAB — ABO/RH: ABO/RH(D): A POS

## 2014-09-14 LAB — RPR

## 2014-09-14 MED ORDER — OXYTOCIN BOLUS FROM INFUSION
500.0000 mL | INTRAVENOUS | Status: DC
  Administered 2014-09-14: 500 mL via INTRAVENOUS

## 2014-09-14 MED ORDER — FLEET ENEMA 7-19 GM/118ML RE ENEM: 1.0000 | ENEMA | RECTAL | Status: DC | PRN

## 2014-09-14 MED ORDER — SIMETHICONE 80 MG PO CHEW: 80.0000 mg | CHEWABLE_TABLET | ORAL | Status: DC | PRN

## 2014-09-14 MED ORDER — TETANUS-DIPHTH-ACELL PERTUSSIS 5-2.5-18.5 LF-MCG/0.5 IM SUSP: 0.5000 mL | Freq: Once | INTRAMUSCULAR | Status: DC

## 2014-09-14 MED ORDER — CITRIC ACID-SODIUM CITRATE 334-500 MG/5ML PO SOLN
30.0000 mL | ORAL | Status: DC | PRN
Start: 2014-09-14 — End: 2014-09-14

## 2014-09-14 MED ORDER — DIPHENHYDRAMINE HCL 25 MG PO CAPS: 25.0000 mg | ORAL_CAPSULE | Freq: Four times a day (QID) | ORAL | Status: DC | PRN

## 2014-09-14 MED ORDER — SENNOSIDES-DOCUSATE SODIUM 8.6-50 MG PO TABS
2.0000 | ORAL_TABLET | ORAL | Status: DC
  Administered 2014-09-14: 2 via ORAL
  Filled 2014-09-14: qty 2

## 2014-09-14 MED ORDER — IBUPROFEN 600 MG PO TABS
600.0000 mg | ORAL_TABLET | Freq: Four times a day (QID) | ORAL | Status: DC
  Administered 2014-09-14 – 2014-09-15 (×6): 600 mg via ORAL
  Filled 2014-09-14 (×6): qty 1

## 2014-09-14 MED ORDER — ONDANSETRON HCL 4 MG/2ML IJ SOLN: 4.0000 mg | Freq: Four times a day (QID) | INTRAMUSCULAR | Status: DC | PRN

## 2014-09-14 MED ORDER — DIBUCAINE 1 % RE OINT
1.0000 "application " | TOPICAL_OINTMENT | RECTAL | Status: DC | PRN
  Administered 2014-09-14: 1 via RECTAL
  Filled 2014-09-14: qty 28

## 2014-09-14 MED ORDER — ZOLPIDEM TARTRATE 5 MG PO TABS: 5.0000 mg | ORAL_TABLET | Freq: Every evening | ORAL | Status: DC | PRN

## 2014-09-14 MED ORDER — ONDANSETRON HCL 4 MG/2ML IJ SOLN: 4.0000 mg | INTRAMUSCULAR | Status: DC | PRN

## 2014-09-14 MED ORDER — ONDANSETRON HCL 4 MG PO TABS: 4.0000 mg | ORAL_TABLET | ORAL | Status: DC | PRN

## 2014-09-14 MED ORDER — ACETAMINOPHEN 325 MG PO TABS: 650.0000 mg | ORAL_TABLET | ORAL | Status: DC | PRN

## 2014-09-14 MED ORDER — OXYCODONE-ACETAMINOPHEN 5-325 MG PO TABS
2.0000 | ORAL_TABLET | ORAL | Status: DC | PRN
  Administered 2014-09-14: 2 via ORAL
  Filled 2014-09-14: qty 2

## 2014-09-14 MED ORDER — LANOLIN HYDROUS EX OINT: TOPICAL_OINTMENT | CUTANEOUS | Status: DC | PRN

## 2014-09-14 MED ORDER — LACTATED RINGERS IV SOLN: INTRAVENOUS | Status: DC

## 2014-09-14 MED ORDER — BENZOCAINE-MENTHOL 20-0.5 % EX AERO: 1.0000 "application " | INHALATION_SPRAY | CUTANEOUS | Status: DC | PRN

## 2014-09-14 MED ORDER — LIDOCAINE HCL (PF) 1 % IJ SOLN
30.0000 mL | INTRAMUSCULAR | Status: DC | PRN
  Filled 2014-09-14: qty 30

## 2014-09-14 MED ORDER — WITCH HAZEL-GLYCERIN EX PADS: 1.0000 "application " | MEDICATED_PAD | CUTANEOUS | Status: DC | PRN

## 2014-09-14 MED ORDER — LACTATED RINGERS IV SOLN: 500.0000 mL | INTRAVENOUS | Status: DC | PRN

## 2014-09-14 MED ORDER — OXYTOCIN 40 UNITS IN LACTATED RINGERS INFUSION - SIMPLE MED
62.5000 mL/h | INTRAVENOUS | Status: DC
  Administered 2014-09-14: 62.5 mL/h via INTRAVENOUS
  Filled 2014-09-14: qty 1000

## 2014-09-14 MED ORDER — OXYCODONE-ACETAMINOPHEN 5-325 MG PO TABS
1.0000 | ORAL_TABLET | ORAL | Status: DC | PRN
  Administered 2014-09-14: 1 via ORAL

## 2014-09-14 MED ORDER — PRENATAL MULTIVITAMIN CH
1.0000 | ORAL_TABLET | Freq: Every day | ORAL | Status: DC
  Administered 2014-09-14 – 2014-09-15 (×2): 1 via ORAL
  Filled 2014-09-14 (×2): qty 1

## 2014-09-14 MED ORDER — OXYCODONE-ACETAMINOPHEN 5-325 MG PO TABS: 1.0000 | ORAL_TABLET | ORAL | Status: DC | PRN

## 2014-09-14 MED ORDER — OXYCODONE-ACETAMINOPHEN 5-325 MG PO TABS
2.0000 | ORAL_TABLET | ORAL | Status: DC | PRN
  Filled 2014-09-14: qty 2

## 2014-09-14 NOTE — MAU Note (Signed)
Christy RN reviewed FHT. Okay to go to 173

## 2014-09-14 NOTE — Lactation Note (Signed)
This note was copied from the chart of [name]. Lactation Consultation Note  Patient Name: [name] Today's Date: 09/14/2014 Reason for consult: Initial assessment mom is an experienced breast feeder of 2 other babies , 1 year each. Per mom the baby is nursing a lot , and I have got'en sore , using comfort gels ( [facility]BU RN gave them to me )  The left is worse than the right. LC reviewed steps for latching to prevent further soreness. Prior to latch - LC recommended - breast massage , hand express, ( LC demo for mom and mom able to  re- demo , steady flow of colostrum noted). Latch with firm support and using the breast compression technique for a deep latch, And once the baby is in a good consistent pattern, intermittent compressions. Baby awake and rooting , LC assisted with positioning, and achieving depth  At the breast. Multiply swallows noted , increased with breast compressions. Baby fed for 15 mins, nipple slightly slanted when abby released. LC instructed mom on the use shells , offered her a hand pump ( mom declined because she has a double electric at home).  Mom is already using comfort gels. Mother informed of post-discharge support and given phone number to the lactation department, including services for phone call assistance; out-patient  appointments; and breastfeeding support group. List of other breastfeeding resources in the community given in the handout. Encouraged mother to call for  problems or concerns related to breastfeeding.   Maternal Data Does the patient have breastfeeding experience prior to this delivery?: Yes  Feeding Feeding Type: Breast Fed (right breast ) Length of feed: 15 min  LATCH Score/Interventions Latch: Grasps breast easily, tongue down, lips flanged, rhythmical sucking. Intervention(s): Adjust position;Assist with latch;Breast massage;Breast compression  Audible Swallowing: Spontaneous and intermittent Intervention(s): Skin  to skin  Type of Nipple: Everted at rest and after stimulation  Comfort (Breast/Nipple): Filling, red/small blisters or bruises, mild/mod discomfort  Problem noted: Mild/Moderate discomfort (improved with depth ) Interventions  (Cracked/bleeding/bruising/blister): Reverse pressure;Expressed breast milk to nipple  Hold (Positioning): Assistance needed to correctly position infant at breast and maintain latch. Intervention(s): Breastfeeding basics reviewed;Support Pillows;Position options;Skin to skin  LATCH Score: 8  Lactation Tools Discussed/Used Tools: Comfort gels;Shells (per mom nipples are sore , using comfortgels from [facility] , LC gave shells ) Shell Type: Inverted   Consult Status Consult Status: Follow-up (reasess sore ness ) Date: 09/15/14 Follow-up type: In-patient    Kathrin Greathouseorio, Sherry Page 09/14/2014, 6:38 PM

## 2014-09-14 NOTE — H&P (Signed)
Sherry Page is a 33 y.o. female [id number] @ 41.0wks by 8wk scan presenting for early active labor. Denies leaking or bldg. Has been 4cm for days now, and made change to 6cm in MAU this evening. Her preg [facility] and has been remarkable for 1) AMA 2) hx GDM with preg preg 3) hx ectopic preg 4) plans waterbirth.  History OB History   Grav Para Term Preterm Abortions TAB SAB Ect Mult Living   5 2 2  0 2 1 0 1 0 2     Past Medical History  Diagnosis Date  . 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 Anal fissure 2010    after last pregnancy  . Urinary tract infection   . Hemorrhoids   . Gestational diabetes 2010    Glucometer testing 4x daily  . Ectopic pregnancy 11/14    methotrexate   Past Surgical History  Procedure Laterality Date  . Induced abortion     Family History: family history includes Anemia in her sister; Depression in her paternal grandmother; Diabetes in her paternal grandmother; Heart attack in her maternal grandmother; Heart disease in her father and maternal grandmother; Heart failure in her father; Stroke in her maternal grandfather. There is no history of Anesthesia problems. Social History:  reports that she has quit smoking. Her smoking use included Cigarettes. She has a 2.5 pack-year smoking history. She has never used smokeless tobacco. She reports that she drinks alcohol. She reports that she does not use illicit drugs.   Prenatal Transfer Tool  Maternal Diabetes: No Genetic Screening: Declined Maternal Ultrasounds/Referrals: Normal Fetal Ultrasounds or other Referrals:  None Maternal Substance Abuse:  No Significant Maternal Medications:  None Significant Maternal Lab Results:  Lab values include: Group B Strep negative Other Comments:  None  ROS  Dilation: 6.5 Effacement (%): 100 Station: -1 Exam by:: K Fraley RN Blood pressure 127/74, pulse 82, temperature 98 F (36.7 C), temperature source Oral, resp.  rate 20, height 5\' 8"  (1.727 m), weight 73.211 kg (161 lb 6.4 oz), last menstrual period 09/28/2013, SpO2 98.00%, unknown if currently breastfeeding. Exam Physical Exam  Constitutional: She is oriented to person, place, and time. She appears well-developed.  HENT:  Head: Normocephalic.  Neck: Normal range of motion.  Cardiovascular: Normal rate.   Respiratory: Effort normal.  GI:  EFM 120-130, +accels, no decels Ctx q 5-467mins  Musculoskeletal: Normal range of motion.  Neurological: She is alert and oriented to person, place, and time.  Skin: Skin is warm and dry.  Psychiatric: She has a normal mood and affect. Her behavior is normal. Thought content normal.    Prenatal labs: ABO, Rh: --/--/A POS (10/29 0130) Antibody: NEG (10/29 0130) Rubella: 0.64 (02/25 0958) RPR: NON REAC (07/31 0900)  HBsAg: NEGATIVE (02/25 0958)  HIV: NONREACTIVE (07/31 0900)  GBS: Negative (10/28 0000)   Assessment/Plan: IUP @ 41.0wks Early active labor Desires waterbirth (has consent/class) & does not risk out  [facility] Expectant management Anticipate SVD Recently got into tub  SHAW, [name] CNM 09/14/2014, 2:58 AM

## 2014-09-15 ENCOUNTER — Encounter: Payer: BC Managed Care – PPO | Admitting: Advanced Practice Midwife

## 2014-09-15 NOTE — Discharge Summary (Signed)
Attestation of Attending Supervision of Advanced Practitioner (CNM/NP): Evaluation and management procedures were performed by the Advanced Practitioner under my supervision and collaboration.  I have reviewed the Advanced Practitioner's note and chart, and I agree with the management and plan.  HARRAWAY-SMITH, Ryann Pauli 9:11 AM     

## 2014-09-15 NOTE — Lactation Note (Signed)
This note was copied from the chart of Sherry [employer] Bart. Lactation Consultation Note  Patient Name: Sherry Page Today's Date: 09/15/2014 Reason for consult: Follow-up assessment;Breast/nipple pain (per mom left nipple sorer tha right , breast are fuller ) Mom called LC back in after the baby was latched , LC noted the baby  to be feeding in a consistent pattern with multiply swallows. Increased swallows with breast compressions. Per mom more comfortable. Stressed to mom to use the steps for latching as previously discussed to prevent soreness from increasing. LC recommended if soreness isn't improve by Monday to call to set up and LC O/P apt.    Maternal Data    Feeding Feeding Type: Breast Fed Length of feed:  (latched/ depth and feeding in a consistent pattern/swallows)  LATCH Score/Interventions Latch:  (latched with depth )  Audible Swallowing:  (multiply swallows , increased with breast compressions )  Type of Nipple:  (erect , some areola edema )  Comfort (Breast/Nipple):  (per mom more comfortable )  Problem noted: Mild/Moderate discomfort Interventions (Mild/moderate discomfort): Comfort gels  Hold (Positioning):  (independent with latch and depth ) Intervention(s): Breastfeeding basics reviewed  LATCH Score: 8  Lactation Tools Discussed/Used Tools: Shells;Pump;Comfort gels Shell Type: Inverted Breast pump type: Manual WIC Program: No   Consult Status Consult Status: Complete Date: 09/15/14    Kathrin Greathouseorio, Marlys Stegmaier Ann 09/15/2014, 11:15 AM

## 2014-09-15 NOTE — Discharge Summary (Signed)
Obstetric Discharge Summary Reason for Admission: onset of labor Prenatal Procedures: none Intrapartum Procedures: spontaneous vaginal delivery Postpartum Procedures: none Complications-Operative and Postpartum: none  Hospital Course:  Patient presented in active labor and progressed without complication.   Delivery Note Pt began pushing spontaneously in the tub. With the third ctx the head became visible and shortly after at 4:42 AM a viable female was delivered via Vaginal, Spontaneous Delivery (Presentation: LOA )- waterbirth. APGAR: 8, 9; weight: pending . Cord clamped and cut by FOB. Father then took the baby skin-to-skin and the pt was tx to the bed for placenta delivery. Placenta status: Intact, spont . Cord: 3 vessel  Anesthesia: None  Episiotomy: None  Lacerations: None (sm 1st degree perineal skin separation- not bldg, not repaired)  Est. Blood Loss (mL): 250  Mom to postpartum. Baby to Couplet care / Skin to Skin.  Sherry Page CNM  09/14/2014, 5:24 AM  Postpartum, the patient did well with no complications.  On the day of discharge,  Pt denied problems with ambulating, voiding or po intake.  She denied nausea or vomiting.  Pain was well controlled.  She has had flatus. She has not had bowel movement.  Lochia Small.  Plan for birth control is IUD, natural family planning (NFP).    H/H: Lab Results  Component Value Date/Time   HGB 13.4 09/14/2014  1:30 AM   HCT 38.7 09/14/2014  1:30 AM    Filed Vitals:   09/15/14 0635  BP: 105/56  Pulse: 72  Temp: 98.1 F (36.7 C)  Resp: 16    Physical Exam: VSS NAD Abd: Appropriately tender, ND, Fundus firm No c/c/e, Neg homan's sign, neg cords Lochia Appropriate  Discharge Diagnoses: Term Pregnancy-delivered  Discharge Information: Date: 09/15/2014 Activity: pelvic rest Diet: routine  Medications: PNV and Tylenol #3 Breast feeding:  Yes Condition: stable Instructions: refer to handout Discharge to: home       Medication List    ASK your doctor about these medications       acetaminophen 500 MG tablet  Commonly known as:  TYLENOL  Take 1,000 mg by mouth daily as needed for moderate pain.     prenatal multivitamin Tabs tablet  Take 1 tablet by mouth daily at 12 noon.     sodium phosphate enema  Commonly known as:  FLEET  Place 1 enema rectally once. follow package directions     zolpidem 5 MG tablet  Commonly known as:  AMBIEN  Take 1 tablet (5 mg total) by mouth at bedtime as needed for sleep.         Sherry Page 09/15/2014,7:48 AM   I have seen and examined this patient and agree the above assessment. Sherry Page 09/15/2014 8:10 AM

## 2014-09-15 NOTE — Lactation Note (Addendum)
This note was copied from the chart of Sherry [employer] Kesinger. Lactation Consultation Note  Patient Name: Sherry Page Today's Date: 09/15/2014 Reason for consult: Follow-up assessment;Breast/nipple pain Per mom nipples are tender , left greater than right . LC assessed with mom permission. Nipples still dark pink , appear less compared to yesterday. Mom has been using the comfort gels ,  And per mom have been helping. Hasn't used the shells yet. LC noted swelling at the base of the areola  . Reviewed steps for latching and stressed the importance of the breast massage , hand express, prepump to make the  Base of the nipple areola more elastic and then reverse pressure, breast compressions with latch , LC reviewed sore nipple and engorgement prevention and tx .  Mother informed of post-discharge support and given phone number to the lactation department, including services for phone  call assistance; out-patient appointments; and breastfeeding support group. List of other breastfeeding resources in the community  given in the handout. Encouraged mother to call for problems or concerns related to breastfeeding.   Maternal Data    Feeding Feeding Type: Breast Fed Length of feed: 25 min  LATCH Score/Interventions ( earlier feeding , latch score by MBU RN )  Latch: Repeated attempts needed to sustain latch, nipple held in mouth throughout feeding, stimulation needed to elicit sucking reflex.  Audible Swallowing: Spontaneous and intermittent  Type of Nipple: Everted at rest and after stimulation  Comfort (Breast/Nipple): Filling, red/small blisters or bruises, mild/mod discomfort  Problem noted: Mild/Moderate discomfort Interventions (Mild/moderate discomfort): Comfort gels  Hold (Positioning): No assistance needed to correctly position infant at breast. Intervention(s): Breastfeeding basics reviewed  LATCH Score: 8  Lactation Tools Discussed/Used Tools: Shells;Pump;Comfort  gels Shell Type: Inverted Breast pump type: Manual WIC Program: No   Consult Status Consult Status: Complete Date: 09/15/14    Kathrin Greathouseorio, Axell Trigueros Ann 09/15/2014, 10:52 AM

## 2014-09-15 NOTE — Plan of Care (Signed)
Problem: Discharge Progression Outcomes Goal: MMR given as ordered Outcome: Not Applicable Date Met:  81/01/75 Patient declines rubella vaccine.

## 2014-09-18 ENCOUNTER — Encounter (HOSPITAL_COMMUNITY): Payer: Self-pay | Admitting: Obstetrics

## 2014-11-03 ENCOUNTER — Ambulatory Visit (INDEPENDENT_AMBULATORY_CARE_PROVIDER_SITE_OTHER): Payer: BC Managed Care – PPO | Admitting: Family

## 2014-11-03 ENCOUNTER — Encounter: Payer: Self-pay | Admitting: Family

## 2014-11-03 NOTE — Patient Instructions (Signed)
All [facility]intment [facility] [address] [place], [place] [id number] [phone]

## 2014-11-03 NOTE — Progress Notes (Signed)
  Subjective:     Sherry Page is a 33 y.o. female who presents for a postpartum visit. She is 7 weeks postpartum following a spontaneous vaginal delivery. I have fully reviewed the prenatal and intrapartum course. The delivery was at 41 gestational weeks. Outcome: spontaneous vaginal delivery. Anesthesia: none. Postpartum course has been uncomplicated other than some burning on left breast with let down, slight tenderness on left breast with palpation. Baby's course has been uncomplicated. Baby is feeding by breast. Bleeding no bleeding. Bowel function is normal. Bladder function is normal. Patient is sexually active. Contraception method is withdrawal method. Postpartum depression screening: negative.  The following portions of the patient's history were reviewed and updated as appropriate: allergies, current medications, past family history, past medical history, past social history, past surgical history and problem list.  Review of Systems Pertinent items are noted in HPI.   Objective:    BP 115/61 mmHg  Pulse 80  Resp 16  Ht 5\' 5"  (1.651 m)  Wt 143 lb (64.864 kg)  BMI 23.80 kg/m2  Breastfeeding? Yes  General:  alert, cooperative and appears stated age   Breasts:  inspection negative, no nipple discharge or bleeding, no masses or nodularity palpable  Lungs: clear to auscultation bilaterally  Heart:  regular rate and rhythm, S1, S2 normal, no murmur, click, rub or gallop  Abdomen: soft, non-tender; bowel sounds normal; no masses,  no organomegaly  Pelvic exam not indicated - not bleeding, no pain at vaginal area Assessment:     Normal postpartum exam.    Breast Tenderness   Pap smear not done at today's visit.   Plan:    1. Contraception: coitus interruptus 2. RX Nipple Cream [facility]); also recommended warm compress on left breast and to massage the area.   3. Follow up as needed.   4.  Explained that even if defer pap smear still recommended to come in for Well  Woman Exam.    Marlis EdelsonWalidah N Karim, CNM

## 2014-12-27 ENCOUNTER — Ambulatory Visit (INDEPENDENT_AMBULATORY_CARE_PROVIDER_SITE_OTHER): Payer: BLUE CROSS/BLUE SHIELD | Admitting: Obstetrics & Gynecology

## 2014-12-27 ENCOUNTER — Encounter: Payer: Self-pay | Admitting: Obstetrics & Gynecology

## 2014-12-27 VITALS — BP 118/75 | HR 94 | Resp 16 | Ht 65.0 in | Wt 139.0 lb

## 2014-12-27 DIAGNOSIS — N816 Rectocele: Secondary | ICD-10-CM

## 2014-12-27 NOTE — Progress Notes (Signed)
   Subjective:    Patient ID: Sherry Page, female    DOB: [birth year], 34 y.o.   MRN: [id number]  HPI  34 yo MW P3 is here today because she has noticed a rectocele. She is unnerved by seeing some of her vaginal mucosa protrude with a BM. She doesn't complain of vaginal laxity with sex although she does complain of very decreased sensation of her vulva with sex. She has some constipation and used fiber. She says that she still has the prolapse even if constipation is not present.  Review of Systems     Objective:   Physical Exam [id number] Breathing normally Abd- benign, non-obese Neuro appears intact 1sw degree rectocele Minimal vaginal laxity, normal size introitus      Assessment & Plan:  We discussed a rectocele and its nature. She is not certain if she will have more children. I have offered a rectocele repair but did warn her that subsequent vaginal deliveries could ruin any repair done now. She will consider her options and RTC prn

## 2015-03-31 ENCOUNTER — Emergency Department
Admission: EM | Admit: 2015-03-31 | Discharge: 2015-03-31 | Disposition: A | Discharge status: Home / Self Care | Payer: BLUE CROSS/BLUE SHIELD | Source: Home / Self Care | Attending: Emergency Medicine | Admitting: Emergency Medicine

## 2015-03-31 ENCOUNTER — Encounter: Payer: Self-pay | Admitting: Emergency Medicine

## 2015-03-31 DIAGNOSIS — J0100 Acute maxillary sinusitis, unspecified: Secondary | ICD-10-CM

## 2015-03-31 MED ORDER — AMOXICILLIN 875 MG PO TABS: ORAL_TABLET | ORAL | Status: DC

## 2015-03-31 NOTE — ED Notes (Signed)
Pt c/o facial pain, congestion, HA, and non productive cough x1 week.

## 2015-03-31 NOTE — ED Provider Notes (Signed)
CSN: [id number]     Arrival date & time 03/31/15  1624 History   First MD Initiated Contact with Patient 03/31/15 1648     Chief Complaint  Patient presents with  . Facial Pain   (Consider location/radiation/quality/duration/timing/severity/associated sxs/prior Treatment) HPI SINUSITIS  Onset: 8 days Facial/sinus pressure with discolored nasal mucus.    Severity: moderate, progressively worsening Tried OTC meds without significant relief.  Symptoms:  + Fever  + URI prodrome with nasal congestion + Minimal swollen neck glands + mild Sinus Headache + mild ear pressure  No Allergy symptoms No significant Sore Throat No eye symptoms     No significant Cough No chest pain No shortness of breath  No wheezing  No Abdominal Pain No Nausea No Vomiting No diarrhea  No Myalgias No focal neurologic symptoms No syncope No Rash  No Urinary symptoms  Remainder of Review of Systems negative for acute change except as noted in the HPI.  Past Medical History  Diagnosis Date  . 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 Anal fissure 2010    after last pregnancy  . Urinary tract infection   . Hemorrhoids   . Gestational diabetes 2010    Glucometer testing 4x daily  . Ectopic pregnancy 11/14    methotrexate   Past Surgical History  Procedure Laterality Date  . Induced abortion     Family History  Problem Relation Age of Onset  . Heart failure Father     Alive  . Heart disease Father   . Stroke Maternal Grandfather     deceased  . Heart attack Maternal Grandmother     alive  . Heart disease Maternal Grandmother   . Anemia Sister     alive  . Diabetes Paternal Grandmother   . Depression Paternal Grandmother   . Anesthesia problems Neg Hx    History  Substance Use Topics  . Smoking status: Former Smoker -- 0.25 packs/day for 10 years    Types: Cigarettes  . Smokeless tobacco: Never Used  . Alcohol Use: Yes   OB History    Gravida Para Term Preterm  AB TAB SAB Ectopic Multiple Living   5 3 3  0 2 1 0 1 0 3     Review of Systems  Allergies  Review of patient's allergies indicates no known allergies.  Home Medications   Prior to Admission medications   Medication Sig Start Date End Date Taking? Authorizing Provider  acetaminophen (TYLENOL) 500 MG tablet Take 1,000 mg by mouth daily as needed for moderate pain.    Historical Provider, MD  amoxicillin (AMOXIL) 875 MG tablet Take 1 twice a day X 10 days. 03/31/15   Lajean Manesavid Massey, MD  Prenatal Vit-Fe Fumarate-FA (PRENATAL MULTIVITAMIN) TABS tablet Take 1 tablet by mouth daily at 12 noon. 01/04/14   Lesly DukesKelly H Leggett, MD   BP 106/70 mmHg  Pulse 90  Temp(Src) 98.2 F (36.8 C) (Oral)  SpO2 98%  Breastfeeding? Yes Physical Exam  Constitutional: She is oriented to person, place, and time. She appears well-developed and well-nourished. No distress.  HENT:  Head: Normocephalic and atraumatic.  Right Ear: Tympanic membrane, external ear and ear canal normal.  Left Ear: Tympanic membrane, external ear and ear canal normal.  Nose: Mucosal edema and rhinorrhea present. Right sinus exhibits maxillary sinus tenderness. Left sinus exhibits maxillary sinus tenderness.  Mouth/Throat: Oropharynx is clear and moist. No oral lesions. No oropharyngeal exudate.  Eyes: Right eye exhibits no discharge.  Left eye exhibits no discharge. No scleral icterus.  Neck: Neck supple.  Cardiovascular: Normal rate, regular rhythm and normal heart sounds.   Pulmonary/Chest: Effort normal and breath sounds normal. She has no wheezes. She has no rales.  Lymphadenopathy:    She has no cervical adenopathy.  Neurological: She is alert and oriented to person, place, and time.  Skin: Skin is warm and dry.  Nursing note and vitals reviewed.   ED Course  Procedures (including critical care time) Labs Review Labs Reviewed - No data to display  Imaging Review No results found.   MDM   1. Acute maxillary sinusitis,  recurrence not specified    Treatment options discussed, as well as risks, benefits, alternatives. Patient voiced understanding and agreement with the following plans: New Prescriptions   AMOXICILLIN (AMOXIL) 875 MG TABLET    Take 1 twice a day X 10 days.   Other symptomatic care discussed, but precautions discussed because she is breast-feeding her 5274-month-old. Follow-up with PCP if no better 7 days, sooner if worse or new symptoms Precautions discussed. Red flags discussed. Questions invited and answered. Patient voiced understanding and agreement.     Lajean Manesavid Massey, MD 03/31/15 [phone]

## 2015-06-23 IMAGING — US US OB COMP LESS 14 WK
1 series · 14 of 28 positions shown · non-contrast
Comparison: None.

CLINICAL DATA: Vaginal bleeding, pregnant

EXAM:
OBSTETRIC <14 WK ULTRASOUND
TECHNIQUE: Transabdominal ultrasound was performed for evaluation of the
gestation as well as the maternal uterus and adnexal regions.

[Series 1: us ob comp less 14 wks · 44 acquisitions, 14 frames shown]
[im 2/44]
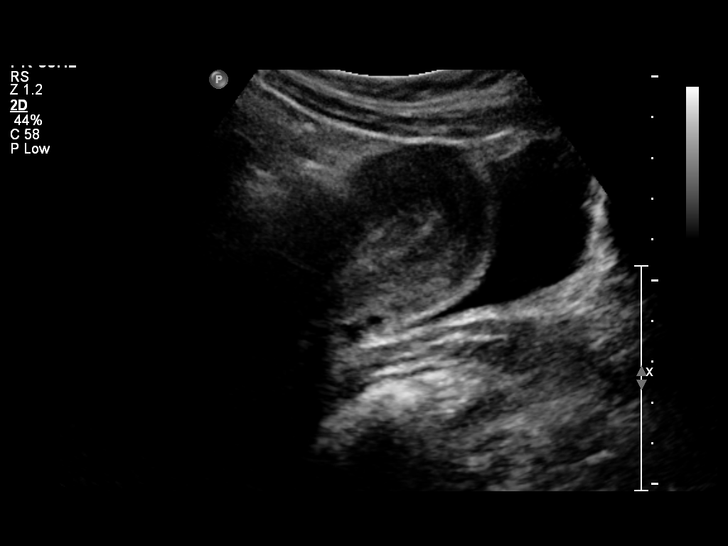
[im 5/44]
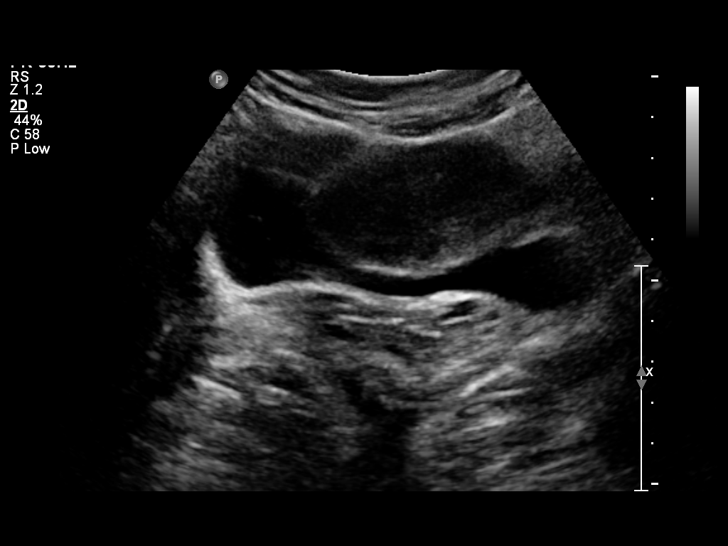
[im 8/44]
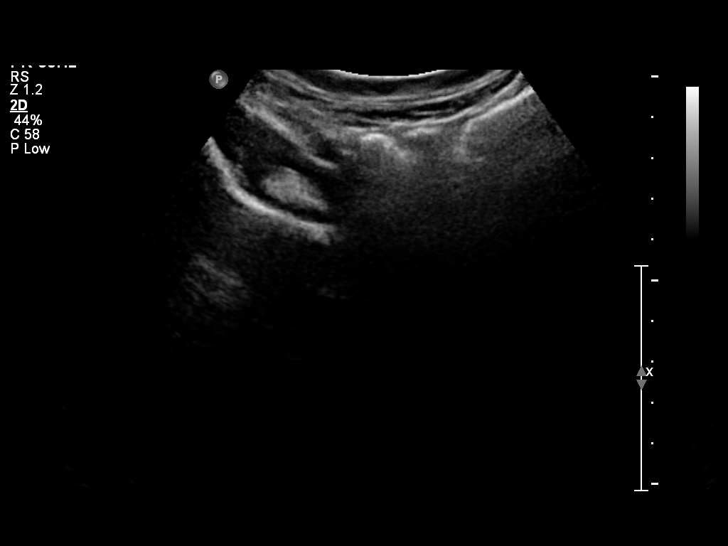
[im 12/44]
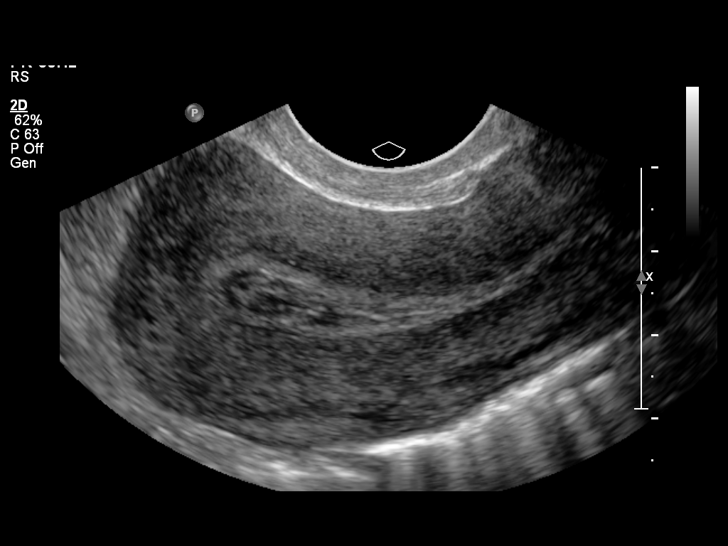
[im 15/44]
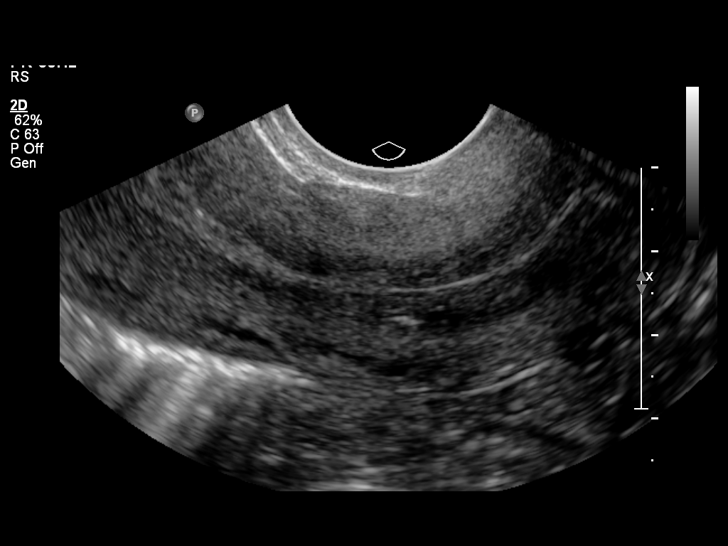
[im 18/44]
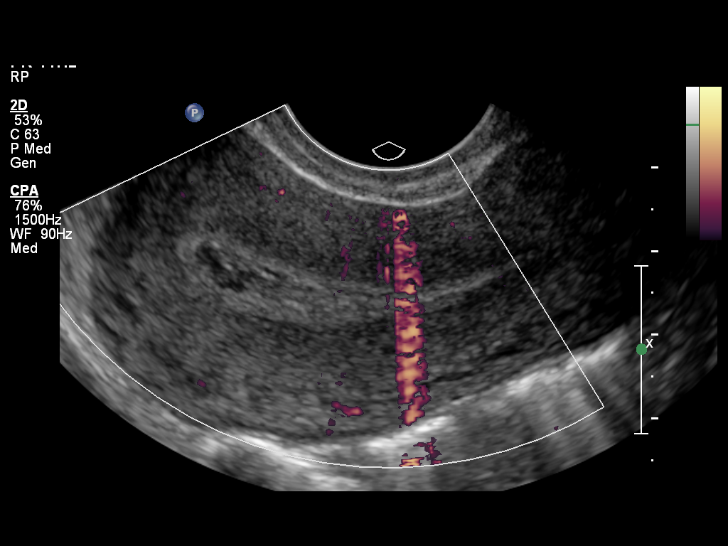
[im 21/44]
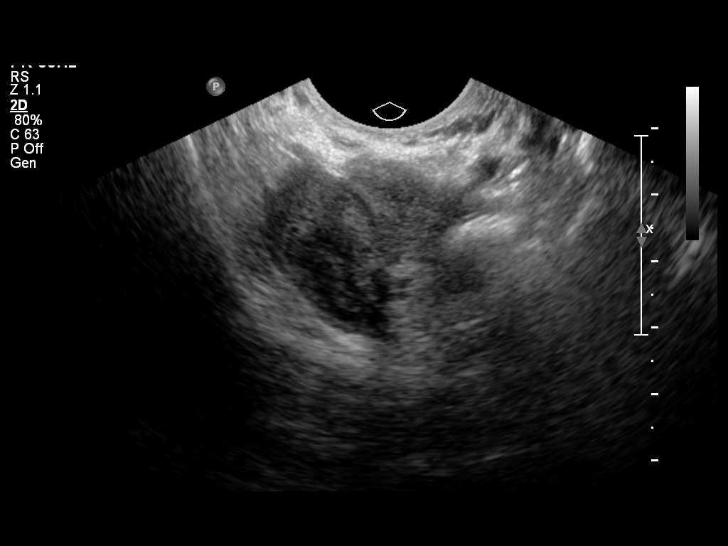
[im 24/44]
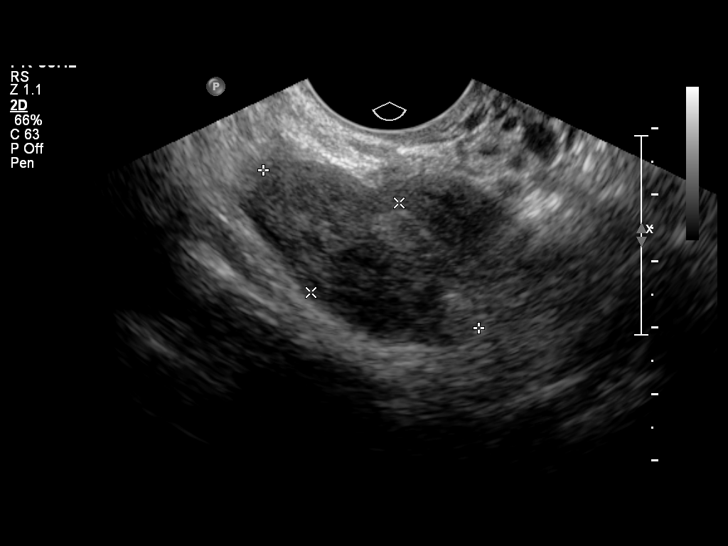
[im 28/44]
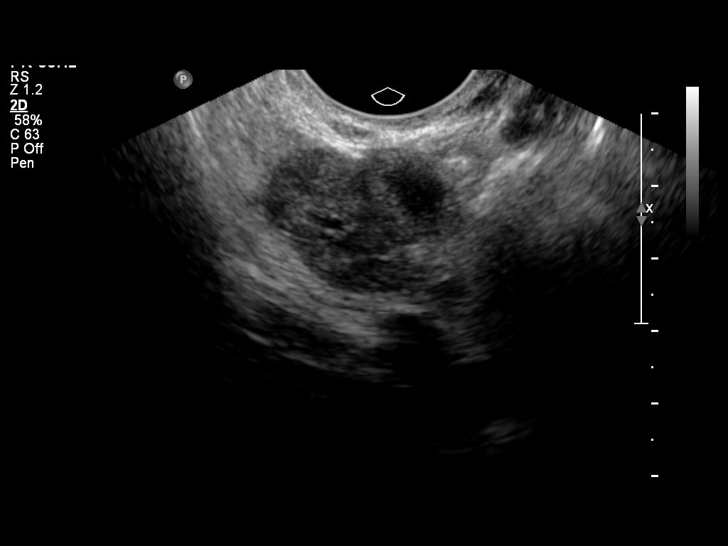
[im 31/44]
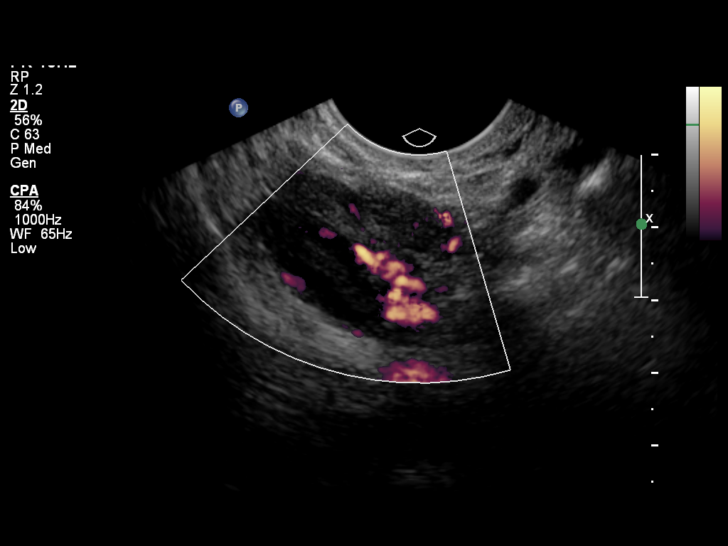
[im 34/44]
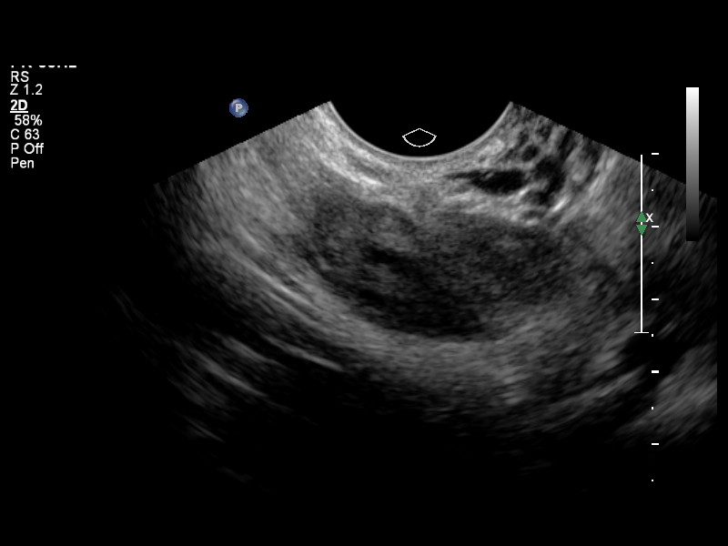
[im 37/44]
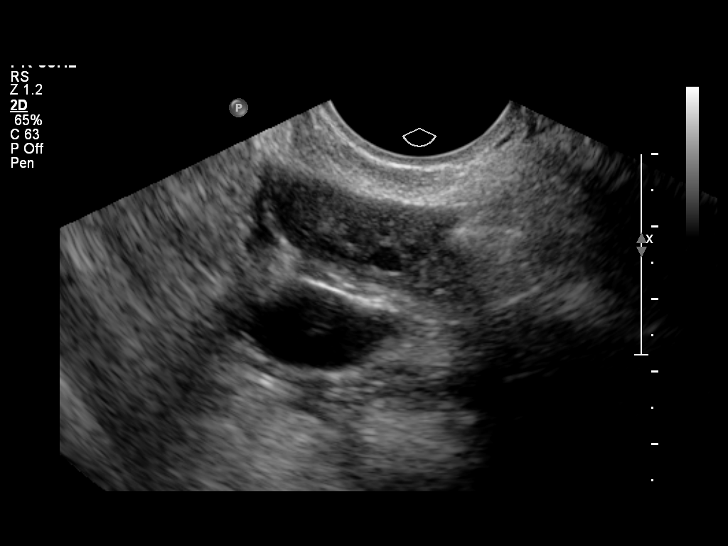
[im 40/44]
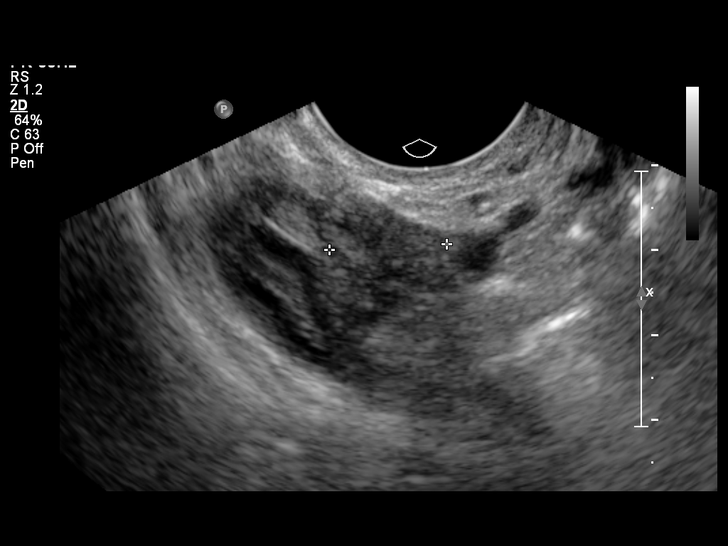
[im 44/44]
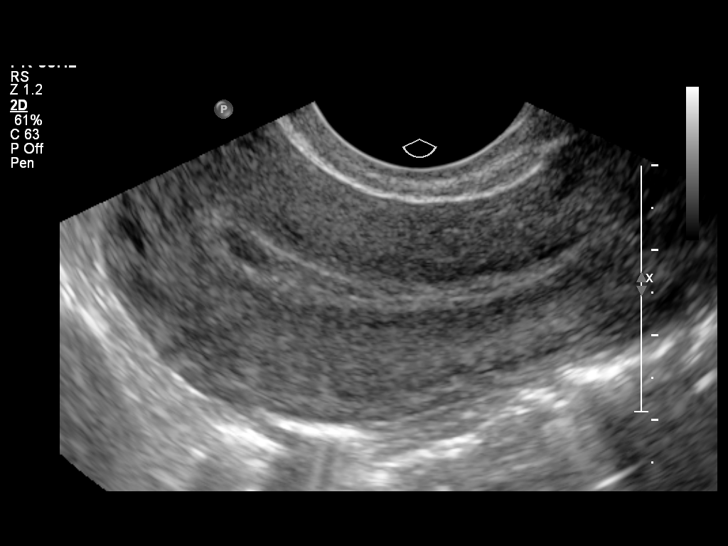

[14 of 28 positions shown; findings below may reference images not displayed]

FINDINGS: Intrauterine gestational sac: Not visualized. There is questioned
small amount of clot within the endometrial cavity

Yolk sac:  Not visualized

Embryo:  Not visualize

Cardiac Activity: Not visualized

Maternal uterus/adnexae: There is a 2.4 x 1.2 cm hypoechoic lesion
adjacent and inseparable from the right ovary. The left ovary is
normal.
IMPRESSION: No intrauterine gestational sac identified. Question small amount
clot within the endometrial cavity. Hypoechoic lesion adjacent and
inseparable from the right ovary. Ectopic pregnancy is not excluded.

## 2015-06-27 ENCOUNTER — Ambulatory Visit: Payer: BLUE CROSS/BLUE SHIELD | Admitting: Obstetrics & Gynecology

## 2015-06-29 ENCOUNTER — Encounter: Payer: Self-pay | Admitting: Advanced Practice Midwife

## 2015-06-29 ENCOUNTER — Ambulatory Visit (INDEPENDENT_AMBULATORY_CARE_PROVIDER_SITE_OTHER): Payer: BLUE CROSS/BLUE SHIELD | Admitting: Advanced Practice Midwife

## 2015-06-29 VITALS — BP 110/70 | HR 78 | Resp 16 | Ht 67.0 in | Wt 131.0 lb

## 2015-06-29 DIAGNOSIS — Z124 Encounter for screening for malignant neoplasm of cervix: Secondary | ICD-10-CM

## 2015-06-29 DIAGNOSIS — Z01419 Encounter for gynecological examination (general) (routine) without abnormal findings: Secondary | ICD-10-CM

## 2015-06-29 DIAGNOSIS — Z1151 Encounter for screening for human papillomavirus (HPV): Secondary | ICD-10-CM

## 2015-06-29 DIAGNOSIS — Z3202 Encounter for pregnancy test, result negative: Secondary | ICD-10-CM

## 2015-06-29 DIAGNOSIS — R300 Dysuria: Secondary | ICD-10-CM

## 2015-06-29 LAB — POCT URINALYSIS DIPSTICK
BILIRUBIN UA: NEGATIVE
GLUCOSE UA: NEGATIVE
Ketones, UA: NEGATIVE
Nitrite, UA: NEGATIVE
Protein, UA: NEGATIVE
SPEC GRAV UA: 1.015
Urobilinogen, UA: 0.2
pH, UA: 7

## 2015-06-29 LAB — POCT URINE PREGNANCY: Preg Test, Ur: NEGATIVE

## 2015-06-29 NOTE — Patient Instructions (Signed)
Norethindrone tablets (contraception) What is this medicine? NORETHINDRONE (nor eth IN drone) is an oral contraceptive. The product contains a female hormone known as a progestin. It is used to prevent pregnancy. This medicine may be used for other purposes; ask your health care provider or pharmacist if you have questions. COMMON BRAND NAME(S): Camila, Deblitane 28-Day, Errin, Heather, [place], J[place], [place], Nor-QD, Nora-BE, Norlyroc, Ortho [facility]or, [employer] 28-Day What should I tell my health care provider before I take this medicine? They need to know if you have any of these conditions: -blood vessel disease or blood clots -breast, cervical, or vaginal cancer -diabetes -heart disease -kidney disease -liver disease -mental depression -migraine -seizures -stroke -vaginal bleeding -an unusual or allergic reaction to norethindrone, other medicines, foods, dyes, or preservatives -pregnant or trying to get pregnant -breast-feeding How should I use this medicine? Take this medicine by mouth with a glass of water. You may take it with or without food. Follow the directions on the prescription label. Take this medicine at the same time each day and in the order directed on the package. Do not take your medicine more often than directed. Contact your pediatrician regarding the use of this medicine in children. Special care may be needed. This medicine has been used in female children who have started having menstrual periods. A patient package insert for the product will be given with each prescription and refill. Read this sheet carefully each time. The sheet may change frequently. Overdosage: If you think you have taken too much of this medicine contact a poison control center or emergency room at once. NOTE: This medicine is only for you. Do not share this medicine with others. What if I miss a dose? Try not to miss a dose. Every time you miss a dose or take a dose late your chance of  pregnancy increases. When 1 pill is missed (even if only 3 hours late), take the missed pill as soon as possible and continue taking a pill each day at the regular time (use a back up method of birth control for the next 48 hours). If more than 1 dose is missed, use an additional birth control method for the rest of your pill pack until menses occurs. Contact your health care professional if more than 1 dose has been missed. What may interact with this medicine? Do not take this medicine with any of the following medications: -amprenavir or fosamprenavir -bosentan This medicine may also interact with the following medications: -antibiotics or medicines for infections, especially rifampin, rifabutin, rifapentine, and griseofulvin, and possibly penicillins or tetracyclines -aprepitant -barbiturate medicines, such as phenobarbital -carbamazepine -felbamate -modafinil -oxcarbazepine -phenytoin -ritonavir or other medicines for HIV infection or AIDS -St. John's wort -topiramate This list may not describe all possible interactions. Give your health care provider a list of all the medicines, herbs, non-prescription drugs, or dietary supplements you use. Also tell them if you smoke, drink alcohol, or use illegal drugs. Some items may interact with your medicine. What should I watch for while using this medicine? Visit your doctor or health care professional for regular checks on your progress. You will need a regular breast and pelvic exam and Pap smear while on this medicine. Use an additional method of birth control during the first cycle that you take these tablets. If you have any reason to think you are pregnant, stop taking this medicine right away and contact your doctor or health care professional. If you are taking this medicine for hormone related problems, it  may take several cycles of use to see improvement in your condition. This medicine does not protect you against HIV infection (AIDS)  or any other sexually transmitted diseases. What side effects may I notice from receiving this medicine? Side effects that you should report to your doctor or health care professional as soon as possible: -breast tenderness or discharge -pain in the abdomen, chest, groin or leg -severe headache -skin rash, itching, or hives -sudden shortness of breath -unusually weak or tired -vision or speech problems -yellowing of skin or eyes Side effects that usually do not require medical attention (report to your doctor or health care professional if they continue or are bothersome): -changes in sexual desire -change in menstrual flow -facial hair growth -fluid retention and swelling -headache -irritability -nausea -weight gain or loss This list may not describe all possible side effects. Call your doctor for medical advice about side effects. You may report side effects to FDA at 1-800-FDA-1088. Where should I keep my medicine? Keep out of the reach of children. Store at room temperature between 15 and 30 degrees C (59 and 86 degrees F). Throw away any unused medicine after the expiration date. NOTE: This sheet is a summary. It may not cover all possible information. If you have questions about this medicine, talk to your doctor, pharmacist, or health care provider.  2015, Elsevier/Gold Standard. (2012-07-23 16:41:35) Health Maintenance Adopting a healthy lifestyle and getting preventive care can go a long way to promote health and wellness. Talk with your health care provider about what schedule of regular examinations is right for you. This is a good chance for you to check in with your provider about disease prevention and staying healthy. In between checkups, there are plenty of things you can do on your own. Experts have done a lot of research about which lifestyle changes and preventive measures are most likely to keep you healthy. Ask your health care provider for more information. WEIGHT  AND DIET  Eat a healthy diet  Be sure to include plenty of vegetables, fruits, low-fat dairy products, and lean protein.  Do not eat a lot of foods high in solid fats, added sugars, or salt.  Get regular exercise. This is one of the most important things you can do for your health.  Most adults should exercise for at least 150 minutes each week. The exercise should increase your heart rate and make you sweat (moderate-intensity exercise).  Most adults should also do strengthening exercises at least twice a week. This is in addition to the moderate-intensity exercise.  Maintain a healthy weight  Body mass index (BMI) is a measurement that can be used to identify possible weight problems. It estimates body fat based on height and weight. Your health care provider can help determine your BMI and help you achieve or maintain a healthy weight.  For females 7 years of age and older:   A BMI below 18.5 is considered underweight.  A BMI of 18.5 to 24.9 is normal.  A BMI of 25 to 29.9 is considered overweight.  A BMI of 30 and above is considered obese.  Watch levels of cholesterol and blood lipids  You should start having your blood tested for lipids and cholesterol at 36 years of age, then have this test every 5 years.  You may need to have your cholesterol levels checked more often if:  Your lipid or cholesterol levels are high.  You are older than 36 years of age.  You are at  high risk for heart disease.  CANCER SCREENING   Lung Cancer  Lung cancer screening is recommended for adults 51-89 years old who are at high risk for lung cancer because of a history of smoking.  A yearly low-dose CT scan of the lungs is recommended for people who:  Currently smoke.  Have quit within the past 15 years.  Have at least a 30-pack-year history of smoking. A pack year is smoking an average of one pack of cigarettes a day for 1 year.  Yearly screening should continue until it has  been 15 years since you quit.  Yearly screening should stop if you develop a health problem that would prevent you from having lung cancer treatment.  Breast Cancer  Practice breast self-awareness. This means understanding how your breasts normally appear and feel.  It also means doing regular breast self-exams. Let your health care provider know about any changes, no matter how small.  If you are in your 20s or 30s, you should have a clinical breast exam (CBE) by a health care provider every 1-3 years as part of a regular health exam.  If you are 15 or older, have a CBE every year. Also consider having a breast X-ray (mammogram) every year.  If you have a family history of breast cancer, talk to your health care provider about genetic screening.  If you are at high risk for breast cancer, talk to your health care provider about having an MRI and a mammogram every year.  Breast cancer gene (BRCA) assessment is recommended for women who have family members with BRCA-related cancers. BRCA-related cancers include:  Breast.  Ovarian.  Tubal.  Peritoneal cancers.  Results of the assessment will determine the need for genetic counseling and BRCA1 and BRCA2 testing. Cervical Cancer Routine pelvic examinations to screen for cervical cancer are no longer recommended for nonpregnant women who are considered low risk for cancer of the pelvic organs (ovaries, uterus, and vagina) and who do not have symptoms. A pelvic examination may be necessary if you have symptoms including those associated with pelvic infections. Ask your health care provider if a screening pelvic exam is right for you.   The Pap test is the screening test for cervical cancer for women who are considered at risk.  If you had a hysterectomy for a problem that was not cancer or a condition that could lead to cancer, then you no longer need Pap tests.  If you are older than 65 years, and you have had normal Pap tests for the  past 10 years, you no longer need to have Pap tests.  If you have had past treatment for cervical cancer or a condition that could lead to cancer, you need Pap tests and screening for cancer for at least 20 years after your treatment.  If you no longer get a Pap test, assess your risk factors if they change (such as having a new sexual partner). This can affect whether you should start being screened again.  Some women have medical problems that increase their chance of getting cervical cancer. If this is the case for you, your health care provider may recommend more frequent screening and Pap tests.  The human papillomavirus (HPV) test is another test that may be used for cervical cancer screening. The HPV test looks for the virus that can cause cell changes in the cervix. The cells collected during the Pap test can be tested for HPV.  The HPV test can be used to  screen women 33 years of age and older. Getting tested for HPV can extend the interval between normal Pap tests from three to five years.  An HPV test also should be used to screen women of any age who have unclear Pap test results.  After 36 years of age, women should have HPV testing as often as Pap tests.  Colorectal Cancer  This type of cancer can be detected and often prevented.  Routine colorectal cancer screening usually begins at 36 years of age and continues through 36 years of age.  Your health care provider may recommend screening at an earlier age if you have risk factors for colon cancer.  Your health care provider may also recommend using home test kits to check for hidden blood in the stool.  A small camera at the end of a tube can be used to examine your colon directly (sigmoidoscopy or colonoscopy). This is done to check for the earliest forms of colorectal cancer.  Routine screening usually begins at age 40.  Direct examination of the colon should be repeated every 5-10 years through 36 years of age. However,  you may need to be screened more often if early forms of precancerous polyps or small growths are found. Skin Cancer  Check your skin from head to toe regularly.  Tell your health care provider about any new moles or changes in moles, especially if there is a change in a mole's shape or color.  Also tell your health care provider if you have a mole that is larger than the size of a pencil eraser.  Always use sunscreen. Apply sunscreen liberally and repeatedly throughout the day.  Protect yourself by wearing long sleeves, pants, a wide-brimmed hat, and sunglasses whenever you are outside. HEART DISEASE, DIABETES, AND HIGH BLOOD PRESSURE   Have your blood pressure checked at least every 1-2 years. High blood pressure causes heart disease and increases the risk of stroke.  If you are between 87 years and 2 years old, ask your health care provider if you should take aspirin to prevent strokes.  Have regular diabetes screenings. This involves taking a blood sample to check your fasting blood sugar level.  If you are at a normal weight and have a low risk for diabetes, have this test once every three years after 36 years of age.  If you are overweight and have a high risk for diabetes, consider being tested at a younger age or more often. PREVENTING INFECTION  Hepatitis B  If you have a higher risk for hepatitis B, you should be screened for this virus. You are considered at high risk for hepatitis B if:  You were born in a country where hepatitis B is common. Ask your health care provider which countries are considered high risk.  Your parents were born in a high-risk country, and you have not been immunized against hepatitis B (hepatitis B vaccine).  You have HIV or AIDS.  You use needles to inject street drugs.  You live with someone who has hepatitis B.  You have had sex with someone who has hepatitis B.  You get hemodialysis treatment.  You take certain medicines for  conditions, including cancer, organ transplantation, and autoimmune conditions. Hepatitis C  Blood testing is recommended for:  Everyone born from [birth year] through 1967.  Anyone with known risk factors for hepatitis C. Sexually transmitted infections (STIs)  You should be screened for sexually transmitted infections (STIs) including gonorrhea and chlamydia if:  You are sexually  active and are younger than 36 years of age.  You are older than 36 years of age and your health care provider tells you that you are at risk for this type of infection.  Your sexual activity has changed since you were last screened and you are at an increased risk for chlamydia or gonorrhea. Ask your health care provider if you are at risk.  If you do not have HIV, but are at risk, it may be recommended that you take a prescription medicine daily to prevent HIV infection. This is called pre-exposure prophylaxis (PrEP). You are considered at risk if:  You are sexually active and do not regularly use condoms or know the HIV status of your partner(s).  You take drugs by injection.  You are sexually active with a partner who has HIV. Talk with your health care provider about whether you are at high risk of being infected with HIV. If you choose to begin PrEP, you should first be tested for HIV. You should then be tested every 3 months for as long as you are taking PrEP.  PREGNANCY   If you are premenopausal and you may become pregnant, ask your health care provider about preconception counseling.  If you may become pregnant, take 400 to 800 micrograms (mcg) of folic acid every day.  If you want to prevent pregnancy, talk to your health care provider about birth control (contraception). OSTEOPOROSIS AND MENOPAUSE   Osteoporosis is a disease in which the bones lose minerals and strength with aging. This can result in serious bone fractures. Your risk for osteoporosis can be identified using a bone density scan.  If  you are 70 years of age or older, or if you are at risk for osteoporosis and fractures, ask your health care provider if you should be screened.  Ask your health care provider whether you should take a calcium or vitamin D supplement to lower your risk for osteoporosis.  Menopause may have certain physical symptoms and risks.  Hormone replacement therapy may reduce some of these symptoms and risks. Talk to your health care provider about whether hormone replacement therapy is right for you.  HOME CARE INSTRUCTIONS   Schedule regular health, dental, and eye exams.  Stay current with your immunizations.   Do not use any tobacco products including cigarettes, chewing tobacco, or electronic cigarettes.  If you are pregnant, do not drink alcohol.  If you are breastfeeding, limit how much and how often you drink alcohol.  Limit alcohol intake to no more than 1 drink per day for nonpregnant women. One drink equals 12 ounces of beer, 5 ounces of wine, or 1 ounces of hard liquor.  Do not use street drugs.  Do not share needles.  Ask your health care provider for help if you need support or information about quitting drugs.  Tell your health care provider if you often feel depressed.  Tell your health care provider if you have ever been abused or do not feel safe at home. Document Released: 05/19/2011 Document Revised: 03/20/2014 Document Reviewed: 10/05/2013 [facility] Patient Information 2015 [place], [place]. This information is not intended to replace advice given to you by your health care provider. Make sure you discuss any questions you have with your health care provider.

## 2015-06-29 NOTE — Progress Notes (Addendum)
  Subjective:     Sherry Page is a 34 y.o. female and is here for a comprehensive physical exam. The patient reports no problems. Except intermittent dysuria.  None today but every now and then has some. She was very pleased with birth experience  Social History   Social History  . Marital Status: Married    Spouse Name: Trey Paula  . Number of Children: 1  . Years of Education: N/A   Occupational History  . homemaker    Social History Main Topics  . Smoking status: Former Smoker -- 0.25 packs/day for 10 years    Types: Cigarettes  . Smokeless tobacco: Never Used  . Alcohol Use: Yes  . Drug Use: No  . Sexual Activity:    Partners: Male   Other Topics Concern  . Not on file   Social History Narrative   Health Maintenance  Topic Date Due  . Janet Berlin  07/31/1998  . INFLUENZA VACCINE  06/18/2015  . PAP SMEAR  06/22/2016  . HIV Screening  Completed    The following portions of the patient's history were reviewed and updated as appropriate: allergies, current medications, past family history, past medical history, past social history, past surgical history and problem list.  Review of Systems Pertinent items are noted in HPI.   Objective:    BP 110/70 mmHg  Pulse 78  Resp 16  Ht  (1.702 m)  Wt 131 lb (59.421 kg)  BMI 20.51 kg/m2  Breastfeeding? Yes General appearance: alert, cooperative, appears stated age and no distress Back: symmetric, no curvature. ROM normal. No CVA tenderness. Lungs: clear to auscultation bilaterally Breasts: normal appearance, no masses or tenderness Heart: regular rate and rhythm, S1, S2 normal, no murmur, click, rub or gallop Abdomen: soft, non-tender; bowel sounds normal; no masses,  no organomegaly Pelvic: cervix normal in appearance, external genitalia normal, no adnexal masses or tenderness, no cervical motion tenderness, rectovaginal septum normal, uterus normal size, shape, and consistency and vagina normal without  discharge Extremities: extremities normal, atraumatic, no cyanosis or edema    Hematuria noted on UA. Sent to culture Assessment:    Healthy female exam.     Possible UTI Plan:  Pap done Declines STD testing Urine to culture   See After Visit Summary for Counseling Recommendations

## 2015-07-02 LAB — CULTURE, URINE COMPREHENSIVE

## 2015-07-04 LAB — CYTOLOGY - PAP

## 2015-07-05 ENCOUNTER — Other Ambulatory Visit: Payer: Self-pay | Admitting: Advanced Practice Midwife

## 2015-07-05 MED ORDER — NITROFURANTOIN MONOHYD MACRO 100 MG PO CAPS: 100.0000 mg | ORAL_CAPSULE | Freq: Two times a day (BID) | ORAL | Status: DC

## 2015-07-05 NOTE — Progress Notes (Signed)
+   UTI with enterococcus  Rx Macrobid sent in via Epic  Will have RN notify

## 2015-07-06 ENCOUNTER — Telehealth: Payer: Self-pay | Admitting: *Deleted

## 2015-07-06 NOTE — Telephone Encounter (Signed)
-----   Message from Aviva Signs, CNM sent at 07/05/2015 10:11 PM EDT ----- Regarding: UTI UTI + enterococcus  Sent Macrobid Rx  Can you notify her?  Hilda Lias

## 2015-07-06 NOTE — Telephone Encounter (Signed)
LM on voicemail of positive urine culture and antibiotic was sent to [facility].  Pt also notified of neg pap smear.

## 2015-09-17 ENCOUNTER — Ambulatory Visit (INDEPENDENT_AMBULATORY_CARE_PROVIDER_SITE_OTHER): Payer: BLUE CROSS/BLUE SHIELD | Admitting: Physician Assistant

## 2015-09-17 ENCOUNTER — Encounter: Payer: Self-pay | Admitting: Physician Assistant

## 2015-09-17 VITALS — BP 109/64 | HR 79 | Temp 98.3°F | Ht 67.0 in | Wt 130.0 lb

## 2015-09-17 DIAGNOSIS — J209 Acute bronchitis, unspecified: Secondary | ICD-10-CM | POA: Diagnosis not present

## 2015-09-17 MED ORDER — IPRATROPIUM-ALBUTEROL 0.5-2.5 (3) MG/3ML IN SOLN
3.0000 mL | Freq: Four times a day (QID) | RESPIRATORY_TRACT | Status: DC
  Administered 2015-09-17: 3 mL via RESPIRATORY_TRACT

## 2015-09-17 MED ORDER — AZITHROMYCIN 250 MG PO TABS: ORAL_TABLET | ORAL | Status: DC

## 2015-09-17 NOTE — Progress Notes (Signed)
   Subjective:    Patient ID: Sherry Page, female    DOB: [birth year], [age] y.o.   MRN: [id number]  HPI  Patient is a 34 year old female who presents to the clinic with wet-to-dry cough for the last month. She felt like her symptoms started as a head cold and slowly moved into a chest cold. She kept treating herself with Mucinex and Delsym with little relief. She seemingly is not getting much better. Now she feels overall achy. She denies any fever. She has been getting more headaches but denies any sinus pressure. She denies any pain with breathing or wheezing. She does seem to cough more at night.   Review of Systems  All other systems reviewed and are negative.      Objective:   Physical Exam  Constitutional: She is oriented to person, place, and time. She appears well-developed and well-nourished.  HENT:  Head: Normocephalic and atraumatic.  Right Ear: External ear normal.  Left Ear: External ear normal.  Nose: Nose normal.  Mouth/Throat: Oropharynx is clear and moist. No oropharyngeal exudate.  TMs clear bilaterally.  Negative maxillary or frontal sinus tenderness to pressure.    Eyes: Conjunctivae are normal. Right eye exhibits no discharge. Left eye exhibits no discharge.  Neck: Normal range of motion. Neck supple.  Cardiovascular: Normal rate, regular rhythm and normal heart sounds.   Pulmonary/Chest: Effort normal.  I do hear diffuse rhonchi in bilateral lungs that appear to clear with deep cough.  Lymphadenopathy:    She has no cervical adenopathy.  Neurological: She is alert and oriented to person, place, and time.  Psychiatric: She has a normal mood and affect. Her behavior is normal.          Assessment & Plan:    Acute bronchitis-patient was given a treatment of DuoNeb. Patient had moderate response. She did start coughing more and seemingly getting more up. Treated with Z-Pak today. Patient is still nursing and discuss safe cough syrup such as Delsym for  nursing. Follow-up if not improving or symptoms worsening.

## 2015-09-17 NOTE — Patient Instructions (Addendum)
Delsym for cough.   Acute Bronchitis Bronchitis is inflammation of the airways that extend from the windpipe into the lungs (bronchi). The inflammation often causes mucus to develop. This leads to a cough, which is the most common symptom of bronchitis.  In acute bronchitis, the condition usually develops suddenly and goes away over time, usually in a couple weeks. Smoking, allergies, and asthma can make bronchitis worse. Repeated episodes of bronchitis may cause further lung problems.  CAUSES Acute bronchitis is most often caused by the same virus that causes a cold. The virus can spread from person to person (contagious) through coughing, sneezing, and touching contaminated objects. SIGNS AND SYMPTOMS   Cough.   Fever.   Coughing up mucus.   Body aches.   Chest congestion.   Chills.   Shortness of breath.   Sore throat.  DIAGNOSIS  Acute bronchitis is usually diagnosed through a physical exam. Your health care provider will also ask you questions about your medical history. Tests, such as chest X-rays, are sometimes done to rule out other conditions.  TREATMENT  Acute bronchitis usually goes away in a couple weeks. Oftentimes, no medical treatment is necessary. Medicines are sometimes given for relief of fever or cough. Antibiotic medicines are usually not needed but may be prescribed in certain situations. In some cases, an inhaler may be recommended to help reduce shortness of breath and control the cough. A cool mist vaporizer may also be used to help thin bronchial secretions and make it easier to clear the chest.  HOME CARE INSTRUCTIONS  Get plenty of rest.   Drink enough fluids to keep your urine clear or pale yellow (unless you have a medical condition that requires fluid restriction). Increasing fluids may help thin your respiratory secretions (sputum) and reduce chest congestion, and it will prevent dehydration.   Take medicines only as directed by your health  care provider.  If you were prescribed an antibiotic medicine, finish it all even if you start to feel better.  Avoid smoking and secondhand smoke. Exposure to cigarette smoke or irritating chemicals will make bronchitis worse. If you are a smoker, consider using nicotine gum or skin patches to help control withdrawal symptoms. Quitting smoking will help your lungs heal faster.   Reduce the chances of another bout of acute bronchitis by washing your hands frequently, avoiding people with cold symptoms, and trying not to touch your hands to your mouth, nose, or eyes.   Keep all follow-up visits as directed by your health care provider.  SEEK MEDICAL CARE IF: Your symptoms do not improve after 1 week of treatment.  SEEK IMMEDIATE MEDICAL CARE IF:  You develop an increased fever or chills.   You have chest pain.   You have severe shortness of breath.  You have bloody sputum.   You develop dehydration.  You faint or repeatedly feel like you are going to pass out.  You develop repeated vomiting.  You develop a severe headache. MAKE SURE YOU:   Understand these instructions.  Will watch your condition.  Will get help right away if you are not doing well or get worse.   This information is not intended to replace advice given to you by your health care provider. Make sure you discuss any questions you have with your health care provider.   Document Released: 12/11/2004 Document Revised: 11/24/2014 Document Reviewed: 04/26/2013 Elsevier Interactive Patient Education [employer]2016 Elsevier Inc.

## 2015-10-04 IMAGING — US US OB TRANSVAGINAL
1 series · 14 of 20 positions shown · non-contrast
Comparison: None.

CLINICAL DATA: Pregnant, for dates/viability

EXAM:
TRANSVAGINAL OB ULTRASOUND
TECHNIQUE: Transvaginal ultrasound was performed for complete evaluation of the
gestation as well as the maternal uterus, adnexal regions, and
pelvic cul-de-sac.

[Series 1: us ob transvaginal · 14 of 20 slices shown]
[im 1/20]
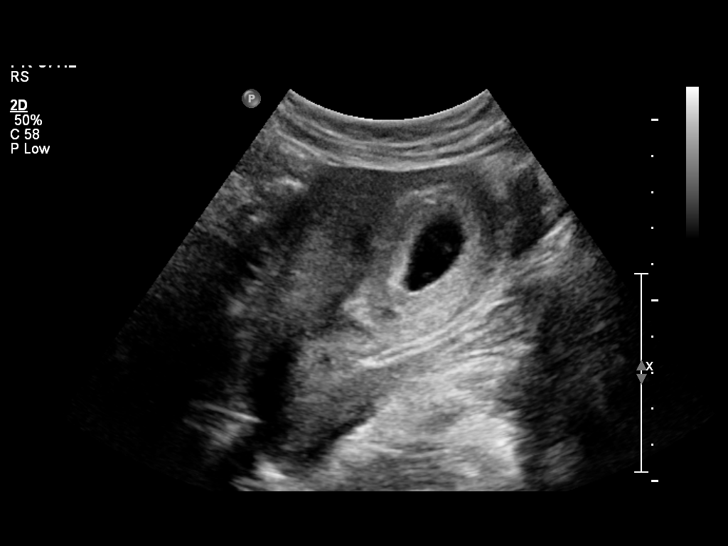
[im 3/20]
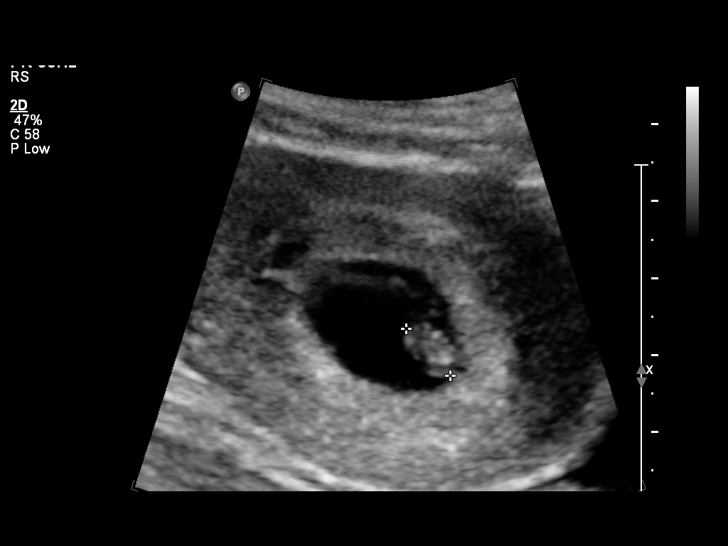
[im 4/20]
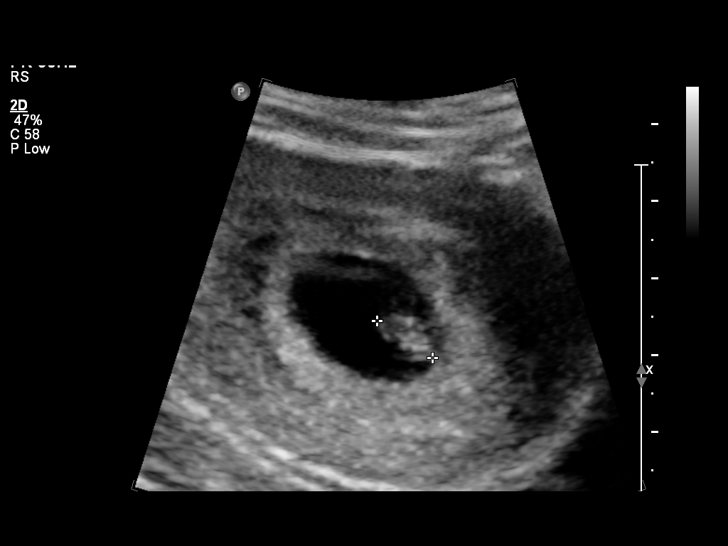
[im 6/20]
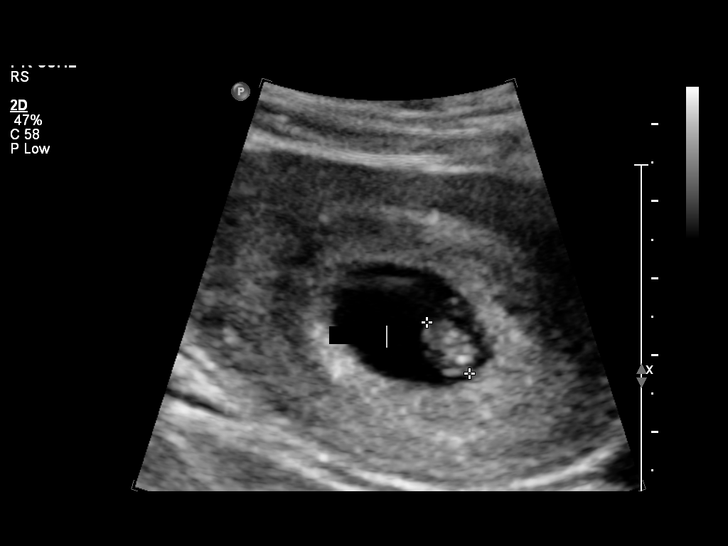
[im 7/20]
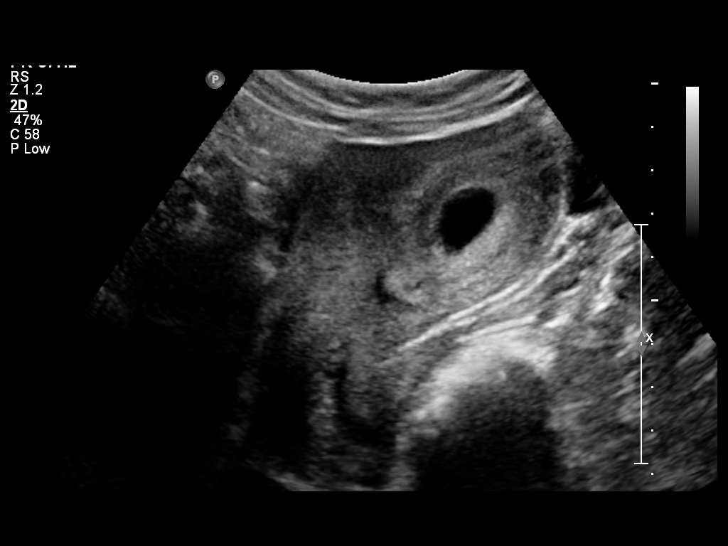
[im 8/20]
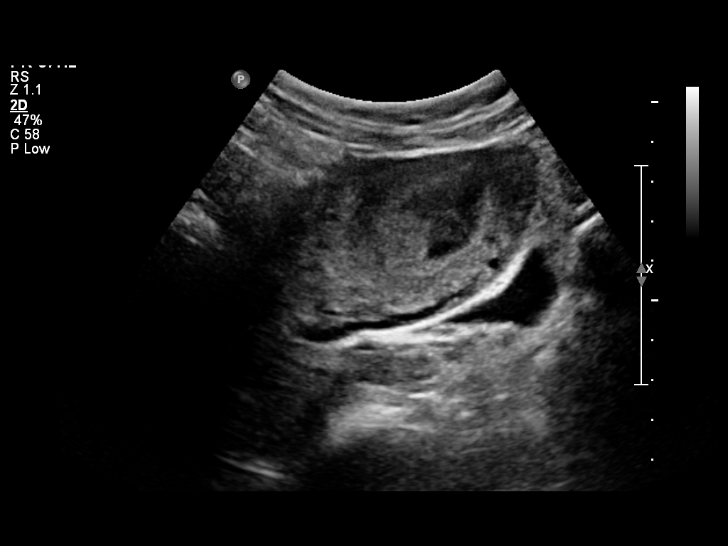
[im 10/20]
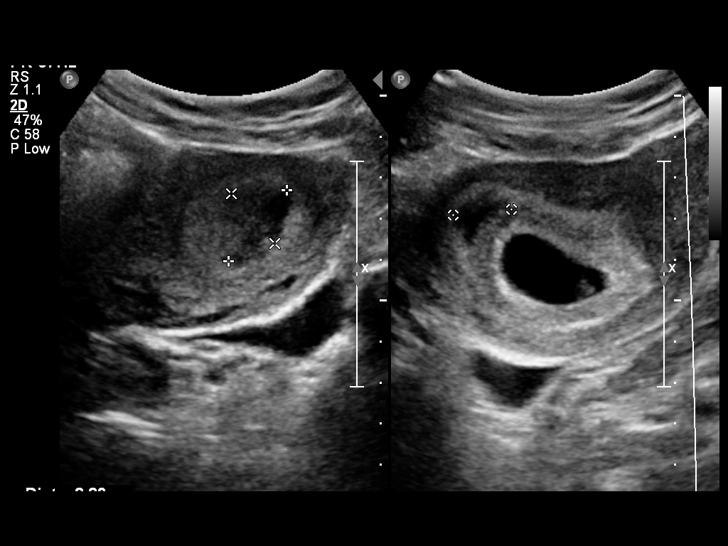
[im 11/20]
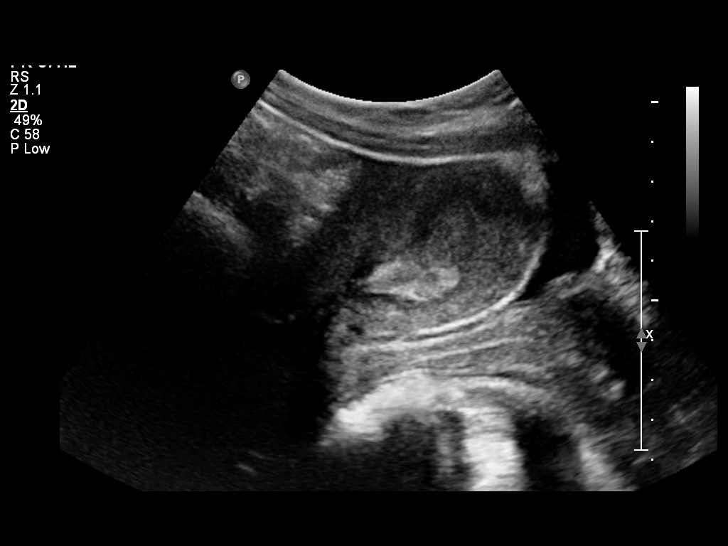
[im 13/20]
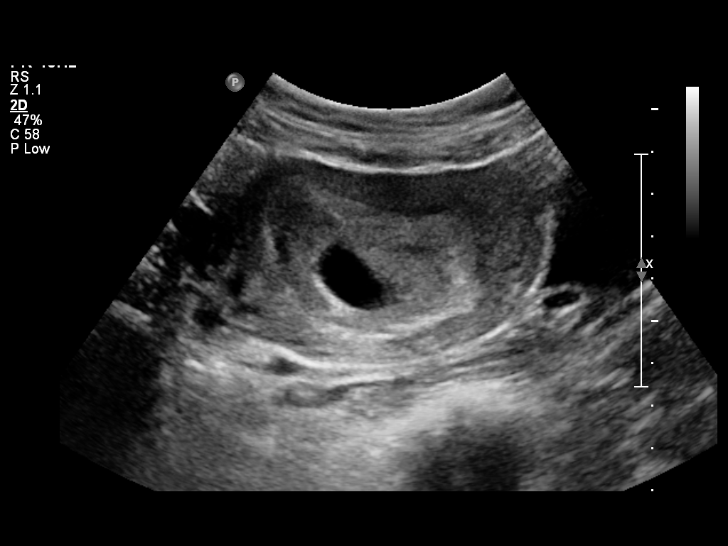
[im 14/20]
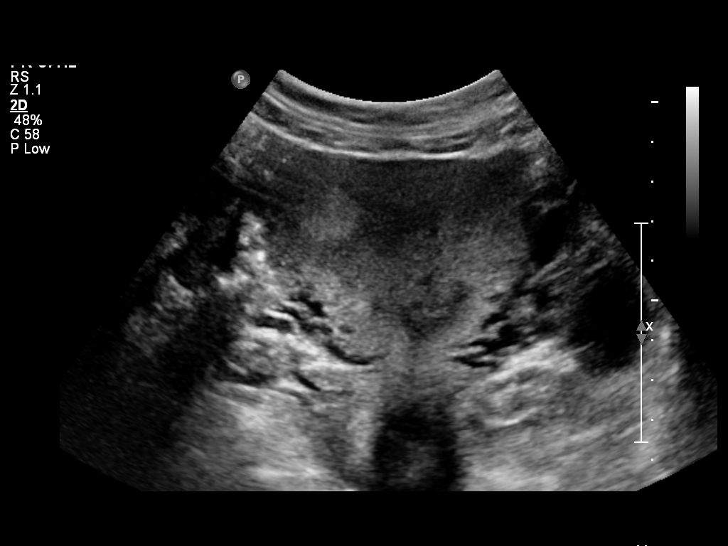
[im 16/20]
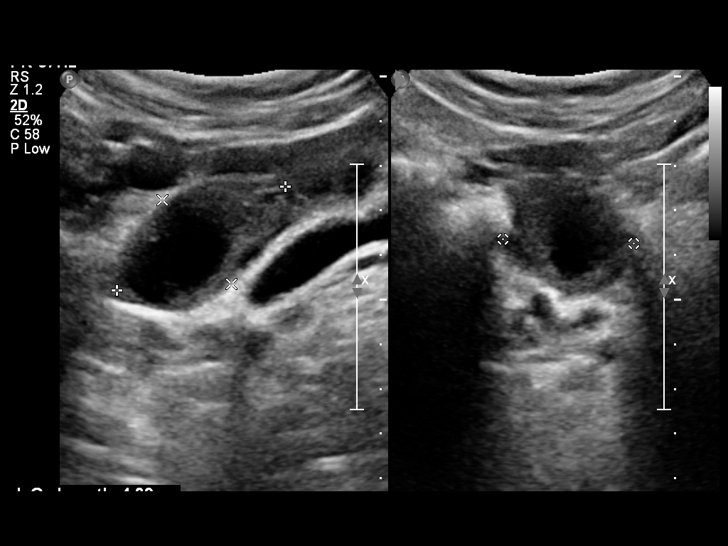
[im 17/20]
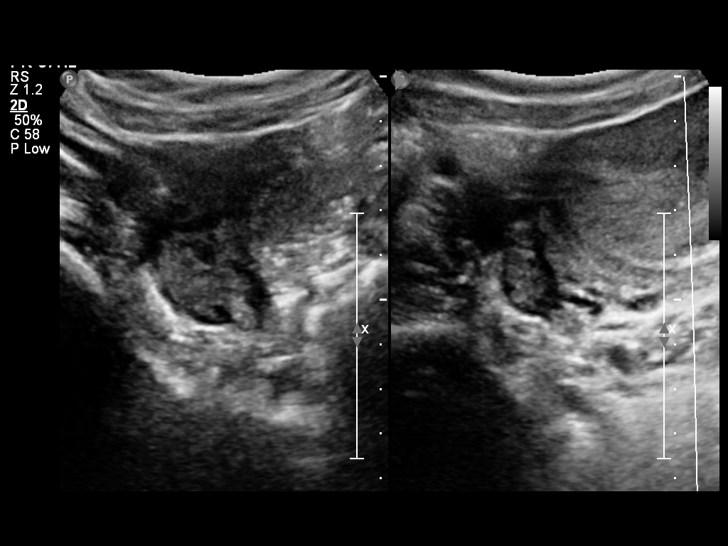
[im 18/20]
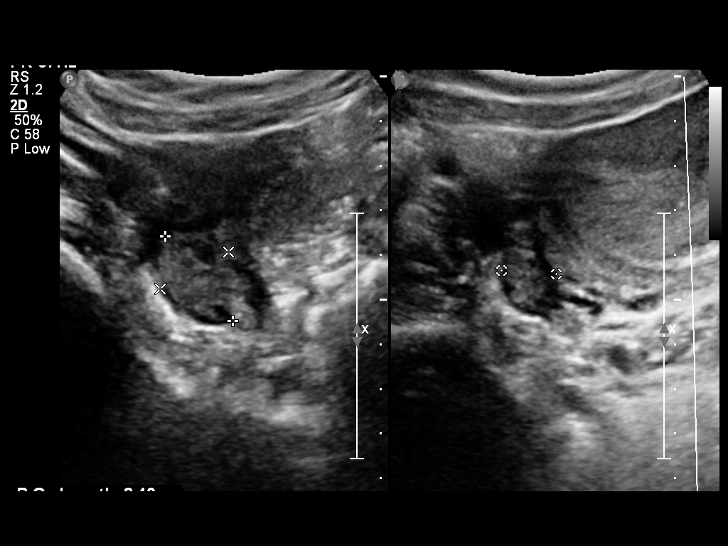
[im 20/20]
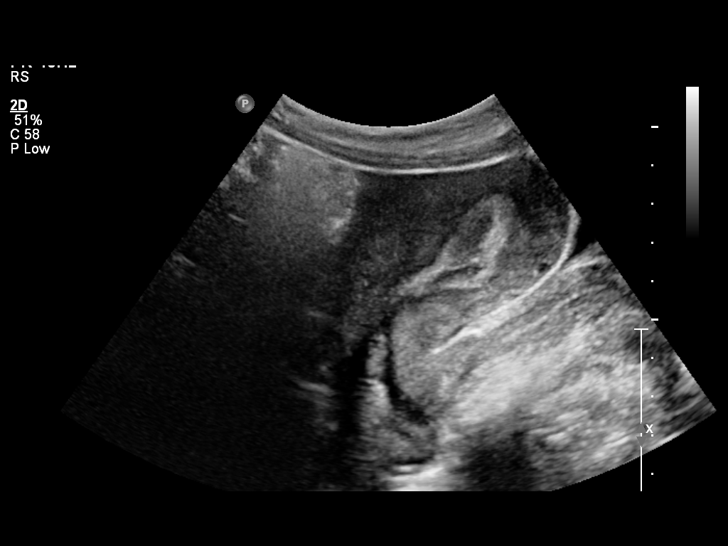

[14 of 20 positions shown; findings below may reference images not displayed]

FINDINGS: Intrauterine gestational sac: Visualized/normal in shape.

Yolk sac:  Present

Embryo:  Present

Cardiac Activity: Present

Heart Rate: 134 bpm

CRL:   8.6  mm   6 w 6 d                  US EDC: 09/07/2014

Maternal uterus/adnexae: Small subchorionic hemorrhage.

Left ovary measures 4.4 x 2.4 x 2.9 cm and is notable for a corpus
luteal cyst.

Right ovary is within normal limits, measuring 2.4 x 1.7 x 1.2 cm.

No free fluid.
IMPRESSION: Single live intrauterine gestation with estimated gestational age 6
weeks 6 days by crown-rump length.

## 2015-12-26 ENCOUNTER — Encounter: Payer: Self-pay | Admitting: Physician Assistant

## 2015-12-26 ENCOUNTER — Ambulatory Visit (INDEPENDENT_AMBULATORY_CARE_PROVIDER_SITE_OTHER): Payer: BLUE CROSS/BLUE SHIELD | Admitting: Physician Assistant

## 2015-12-26 VITALS — BP 103/60 | HR 84 | Ht 67.0 in | Wt 128.0 lb

## 2015-12-26 DIAGNOSIS — Z0000 Encounter for general adult medical examination without abnormal findings: Secondary | ICD-10-CM | POA: Diagnosis not present

## 2015-12-26 DIAGNOSIS — R351 Nocturia: Secondary | ICD-10-CM | POA: Diagnosis not present

## 2015-12-26 DIAGNOSIS — R5383 Other fatigue: Secondary | ICD-10-CM | POA: Insufficient documentation

## 2015-12-26 DIAGNOSIS — R319 Hematuria, unspecified: Secondary | ICD-10-CM

## 2015-12-26 DIAGNOSIS — L853 Xerosis cutis: Secondary | ICD-10-CM | POA: Insufficient documentation

## 2015-12-26 DIAGNOSIS — Z13220 Encounter for screening for lipoid disorders: Secondary | ICD-10-CM

## 2015-12-26 DIAGNOSIS — K5900 Constipation, unspecified: Secondary | ICD-10-CM | POA: Insufficient documentation

## 2015-12-26 DIAGNOSIS — Z8632 Personal history of gestational diabetes: Secondary | ICD-10-CM

## 2015-12-26 DIAGNOSIS — Z8051 Family history of malignant neoplasm of kidney: Secondary | ICD-10-CM

## 2015-12-26 DIAGNOSIS — K5909 Other constipation: Secondary | ICD-10-CM

## 2015-12-26 LAB — POCT URINALYSIS DIPSTICK
BILIRUBIN UA: NEGATIVE
Glucose, UA: NEGATIVE
Ketones, UA: NEGATIVE
LEUKOCYTES UA: NEGATIVE
Nitrite, UA: NEGATIVE
PH UA: 6
PROTEIN UA: NEGATIVE
Spec Grav, UA: <=1.005
Urobilinogen, UA: 0.2

## 2015-12-26 LAB — CBC
HCT: 39 % (ref 36.0–46.0)
Hemoglobin: 12.9 g/dL (ref 12.0–15.0)
MCH: 31.2 pg (ref 26.0–34.0)
MCHC: 33.1 g/dL (ref 30.0–36.0)
MCV: 94.2 fL (ref 78.0–100.0)
MPV: 9.9 fL (ref 8.6–12.4)
PLATELETS: 214 10*3/uL (ref 150–400)
RBC: 4.14 MIL/uL (ref 3.87–5.11)
RDW: 13.3 % (ref 11.5–15.5)
WBC: 5.3 10*3/uL (ref 4.0–10.5)

## 2015-12-26 LAB — COMPREHENSIVE METABOLIC PANEL
ALT: 10 U/L (ref 6–29)
AST: 13 U/L (ref 10–30)
Albumin: 4.1 g/dL (ref 3.6–5.1)
Alkaline Phosphatase: 46 U/L (ref 33–115)
BUN: 11 mg/dL (ref 7–25)
CO2: 28 mmol/L (ref 20–31)
CREATININE: 0.7 mg/dL (ref 0.50–1.10)
Calcium: 9.2 mg/dL (ref 8.6–10.2)
Chloride: 106 mmol/L (ref 98–110)
GLUCOSE: 70 mg/dL (ref 65–99)
Potassium: 4.1 mmol/L (ref 3.5–5.3)
SODIUM: 141 mmol/L (ref 135–146)
TOTAL PROTEIN: 6.6 g/dL (ref 6.1–8.1)
Total Bilirubin: 0.5 mg/dL (ref 0.2–1.2)

## 2015-12-26 LAB — TSH: TSH: 1.33 m[IU]/L

## 2015-12-26 LAB — VITAMIN B12: VITAMIN B 12: 807 pg/mL (ref 200–1100)

## 2015-12-26 LAB — FERRITIN: Ferritin: 44 ng/mL (ref 10–154)

## 2015-12-26 LAB — C-REACTIVE PROTEIN

## 2015-12-26 NOTE — Progress Notes (Addendum)
Subjective:    Patient ID: Sherry Page, female    DOB: 1981-04-19, 35 y.o.   MRN: [id number]  HPI  Patient is a 35 year old female presenting for a complete physical. Patient's concerns for this visit include nocturia since she had her last child fifteen months ago, excessive fatigue during the day, skin dryness, constipation, and some tingling in her distal extremities. Patient's most recent pap smear occurred one year ago. Patient states that she is still breastfeeding and has not resumed a regular menstrual cycle. Patient denies dysuria, fever, or back pain. Her father did past away from renal cancer last year.   .. Active Ambulatory Problems    Diagnosis Date Noted  . History of ectopic pregnancy 02/24/2014  . Rectoceles 12/27/2014  . History of gestational diabetes 12/26/2015   Resolved Ambulatory Problems    Diagnosis Date Noted  . Anal fissure 01/07/2010  . Dysuria 02/13/2010  . Acute sinusitis, unspecified 04/25/2008  . ALLERGIC RHINITIS 04/25/2008  . Acute maxillary sinusitis 11/14/2010  . Previous gestational diabetes mellitus, antepartum 06/13/2011  . Supervision of normal pregnancy 12/29/2011  . NSVD (normal spontaneous vaginal delivery) 01/19/2012  . Ectopic pregnancy 10/14/2013  . Encounter for supervision of other normal pregnancy in second trimester 06/16/2014  . AMA (advanced maternal age) multigravida 35+ 06/30/2014  . Active labor 09/14/2014   Past Medical History  Diagnosis Date  . Abnormal Pap smear 2006  . Urinary tract infection   . Hemorrhoids   . Gestational diabetes 2010   .Marland Kitchen Family History  Problem Relation Age of Onset  . Heart failure Father     Alive  . Heart disease Father   . Stroke Maternal Grandfather     deceased  . Heart attack Maternal Grandmother     alive  . Heart disease Maternal Grandmother   . Anemia Sister     alive  . Diabetes Paternal Grandmother   . Depression Paternal Grandmother   . Anesthesia problems  Neg Hx    .Marland Kitchen Social History   Social History  . Marital Status: Married    Spouse Name: Sherry Page  . Number of Children: 1  . Years of Education: N/A   Occupational History  . homemaker    Social History Main Topics  . Smoking status: Former Smoker -- 0.25 packs/day for 10 years    Types: Cigarettes  . Smokeless tobacco: Never Used  . Alcohol Use: Yes  . Drug Use: No  . Sexual Activity:    Partners: Male   Other Topics Concern  . Not on file   Social History Narrative      Review of Systems Please see HPI.     Objective:   Physical Exam  Constitutional: She is oriented to person, place, and time. She appears well-developed and well-nourished. No distress.  HENT:  Head: Normocephalic and atraumatic.  Right Ear: External ear normal.  Left Ear: External ear normal.  Nose: Nose normal.  Mouth/Throat: Oropharynx is clear and moist.  Eyes: Conjunctivae and EOM are normal. Pupils are equal, round, and reactive to light. Right eye exhibits no discharge. Left eye exhibits no discharge. No scleral icterus.  Neck: Normal range of motion. No thyromegaly present.  Cardiovascular: Normal rate, regular rhythm, normal heart sounds and intact distal pulses.   No murmur heard. Pulmonary/Chest: Effort normal and breath sounds normal. No respiratory distress. She has no wheezes.  Abdominal: Soft. Bowel sounds are normal. She exhibits no distension. There is no tenderness. There is  no rebound and no guarding.  Musculoskeletal: She exhibits no edema or tenderness.  Lymphadenopathy:    She has no cervical adenopathy.  Neurological: She is alert and oriented to person, place, and time. No cranial nerve deficit.  Skin: Skin is warm. No rash noted. She is not diaphoretic. No erythema.  Skin appears dry.   Psychiatric: She has a normal mood and affect. Her behavior is normal. Judgment and thought content normal.       Assessment & Plan:  CPE- up to date on Pap,flu shot. Will check lipid  panel and cmp. Discussed vitamin D 800 units and calcium . Encouraged healthy diet and exercise.   No energy/constipation- will check TSH along with fatigue panel.   parathesia bilateral upper and lower extremities- will check B12 and vitamin D and iron. Follow up if continues to be problematic.   Nocturia/family hx of renal cancer- .Marland Kitchen Results for orders placed or performed in visit on 12/26/15  POCT urinalysis dipstick  Result Value Ref Range   Color, UA light yellow    Clarity, UA slightly cloudy    Glucose, UA neg    Bilirubin, UA neg    Ketones, UA neg    Spec Grav, UA <=1.005    Blood, UA moderate    pH, UA 6.0    Protein, UA neg    Urobilinogen, UA 0.2    Nitrite, UA neg    Leukocytes, UA Negative Negative   Positive for blood. Father died from renal cancer. Will get microscopic and recheck dipstick in 2-4 weeks. Pt is not on her period. If continues to have blood will make referral to urology for evaluation.  Likely she does have some pelvic floor dysfunction from having 3 children. Patient was advised to try Kegal exercises/balls to strengthen the pelvic floor. Follow up as needed.

## 2015-12-26 NOTE — Addendum Note (Signed)
Addended by: Jomarie Longs on: 12/26/2015 11:23 AM   Modules accepted: Level of Service

## 2015-12-26 NOTE — Patient Instructions (Addendum)
Kegel Exercises The goal of Kegel exercises is to isolate and exercise your pelvic floor muscles. These muscles act as a hammock that supports the rectum, vagina, small intestine, and uterus. As the muscles weaken, the hammock sags and these organs are displaced from their normal positions. Kegel exercises can strengthen your pelvic floor muscles and help you to improve bladder and bowel control, improve sexual response, and help reduce many problems and some discomfort during pregnancy. Kegel exercises can be done anywhere and at any time. HOW TO PERFORM KEGEL EXERCISES 1. Locate your pelvic floor muscles. To do this, squeeze (contract) the muscles that you use when you try to stop the flow of urine. You will feel a tightness in the vaginal area (women) and a tight lift in the rectal area (men and women). 2. When you begin, contract your pelvic muscles tight for 2-5 seconds, then relax them for 2-5 seconds. This is one set. Do 4-5 sets with a short pause in between. 3. Contract your pelvic muscles for 8-10 seconds, then relax them for 8-10 seconds. Do 4-5 sets. If you cannot contract your pelvic muscles for 8-10 seconds, try 5-7 seconds and work your way up to 8-10 seconds. Your goal is 4-5 sets of 10 contractions each day. Keep your stomach, buttocks, and legs relaxed during the exercises. Perform sets of both short and long contractions. Vary your positions. Perform these contractions 3-4 times per day. Perform sets while you are:  1. Lying in bed in the morning. 2. Standing at lunch. 3. Sitting in the late afternoon. 4. Lying in bed at night. You should do 40-50 contractions per day. Do not perform more Kegel exercises per day than recommended. Overexercising can cause muscle fatigue. Continue these exercises for for at least 15-20 weeks or as directed by your caregiver.   This information is not intended to replace advice given to you by your health care provider. Make sure you discuss any  questions you have with your health care provider.   Document Released: 10/20/2012 Document Revised: 11/24/2014 Document Reviewed: 10/20/2012 Elsevier Interactive Patient Education 2016 [employer].  Keeping You Healthy  Get These Tests 5. Blood Pressure- Have your blood pressure checked once a year by your health care provider.  Normal blood pressure is 120/80. 6. Weight- Have your body mass index (BMI) calculated to screen for obesity.  BMI is measure of body fat based on height and weight.  You can also calculate your own BMI at https://www.west-esparza.com/. 7. Cholesterol- Have your cholesterol checked every 5 years starting at age 32 then yearly starting at age 52. 77. Chlamydia, HIV, and other sexually transmitted diseases- Get screened every year until age 71, then within three months of each new sexual provider. 9. Pap Test - Every 1-5 years; discuss with your health care provider. 10. Mammogram- Every 1-2 years starting at age 36--50  Take these medicines  Calcium with Vitamin D-Your body needs 1200 mg of Calcium each day and [phone] IU of Vitamin D daily.  Your body can only absorb 500 mg of Calcium at a time so Calcium must be taken in 2 or 3 divided doses throughout the day.  Multivitamin with folic acid- Once daily if it is possible for you to become pregnant.  Get these Immunizations  Gardasil-Series of three doses; prevents HPV related illness such as genital warts and cervical cancer.  Menactra-Single dose; prevents meningitis.  Tetanus shot- Every 10 years.  Flu shot-Every year.  Take these steps 1. Do not  smoke-Your healthcare provider can help you quit.  For tips on how to quit go to www.smokefree.gov or call 1-800 QUITNOW. 2. Be physically active- Exercise 5 days a week for at least 30 minutes.  If you are not already physically active, start slow and gradually work up to 30 minutes of moderate physical activity.  Examples of moderate activity include walking  briskly, dancing, swimming, bicycling, etc. 3. Breast Cancer- A self breast exam every month is important for early detection of breast cancer.  For more information and instruction on self breast exams, ask your healthcare provider or [email or web address]. 4. Eat a healthy diet- Eat a variety of healthy foods such as fruits, vegetables, whole grains, low fat milk, low fat cheeses, yogurt, lean meats, poultry and fish, beans, nuts, tofu, etc.  For more information go to www. Thenutritionsource.org 5. Drink alcohol in moderation- Limit alcohol intake to one drink or less per day. Never drink and drive. 6. Depression- Your emotional health is as important as your physical health.  If you're feeling down or losing interest in things you normally enjoy please talk to your healthcare provider about being screened for depression. 7. Dental visit- Brush and floss your teeth twice daily; visit your dentist twice a year. 8. Eye doctor- Get an eye exam at least every 2 years. 9. Helmet use- Always wear a helmet when riding a bicycle, motorcycle, rollerblading or skateboarding. 10. Safe sex- If you may be exposed to sexually transmitted infections, use a condom. 11. Seat belts- Seat belts can save your live; always wear one. 12. Smoke/Carbon Monoxide detectors- These detectors need to be installed on the appropriate level of your home. Replace batteries at least once a year. 13. Skin cancer- When out in the sun please cover up and use sunscreen 15 SPF or higher. 14. Violence- If anyone is threatening or hurting you, please tell your healthcare provider.

## 2015-12-27 LAB — URINALYSIS, MICROSCOPIC ONLY
Bacteria, UA: NONE SEEN [HPF]
CASTS: NONE SEEN [LPF]
CRYSTALS: NONE SEEN [HPF]
WBC, UA: NONE SEEN WBC/HPF (ref <=5)
YEAST: NONE SEEN [HPF]

## 2015-12-27 LAB — VITAMIN D 25 HYDROXY (VIT D DEFICIENCY, FRACTURES): Vit D, 25-Hydroxy: 32 ng/mL (ref 30–100)

## 2015-12-27 LAB — SEDIMENTATION RATE: Sed Rate: 4 mm/hr (ref 0–20)

## 2016-01-10 ENCOUNTER — Ambulatory Visit (INDEPENDENT_AMBULATORY_CARE_PROVIDER_SITE_OTHER): Payer: BLUE CROSS/BLUE SHIELD | Admitting: Osteopathic Medicine

## 2016-01-10 ENCOUNTER — Encounter: Payer: Self-pay | Admitting: Osteopathic Medicine

## 2016-01-10 VITALS — BP 123/74 | HR 79 | Temp 98.2°F | Wt 127.0 lb

## 2016-01-10 DIAGNOSIS — H9201 Otalgia, right ear: Secondary | ICD-10-CM

## 2016-01-10 DIAGNOSIS — H6991 Unspecified Eustachian tube disorder, right ear: Secondary | ICD-10-CM | POA: Diagnosis not present

## 2016-01-10 DIAGNOSIS — J069 Acute upper respiratory infection, unspecified: Secondary | ICD-10-CM | POA: Diagnosis not present

## 2016-01-10 DIAGNOSIS — B9789 Other viral agents as the cause of diseases classified elsewhere: Principal | ICD-10-CM

## 2016-01-10 MED ORDER — AMOXICILLIN 500 MG PO TABS: 500.0000 mg | ORAL_TABLET | Freq: Three times a day (TID) | ORAL | Status: AC

## 2016-01-10 MED ORDER — IPRATROPIUM BROMIDE 0.03 % NA SOLN: 2.0000 | Freq: Three times a day (TID) | NASAL | Status: AC

## 2016-01-10 MED ORDER — ALBUTEROL SULFATE HFA 108 (90 BASE) MCG/ACT IN AERS
2.0000 | INHALATION_SPRAY | Freq: Four times a day (QID) | RESPIRATORY_TRACT | Status: AC | PRN
Start: 2016-01-10 — End: ?

## 2016-01-10 MED ORDER — BENZONATATE 200 MG PO CAPS: 200.0000 mg | ORAL_CAPSULE | Freq: Three times a day (TID) | ORAL | Status: AC | PRN

## 2016-01-10 MED ORDER — METHYLPREDNISOLONE 4 MG PO TBPK: ORAL_TABLET | ORAL | Status: AC

## 2016-01-10 NOTE — Patient Instructions (Signed)
Try the other medications for symptomatically, most likely this is a virus, your ear does not appear to be infected at this time, most causes of ear pain/sinus congestion and sore throat are due to viral illness which will resolve on its own in 7-10 days. Fill the antibiotic if you start to feel worse or if her symptoms have not resolved 10 days after you first started feeling sick.  I double checked everything for safety with breast-feeding, there is some caution advised with the benzonatate for cough that should be fine. You can also add oral antihistamine such as Benadryl or Claritin, in rare cases this can cause sedation in young breast-feeding infants if you choose to take this medicine watch out for any side effects in your baby.

## 2016-01-10 NOTE — Progress Notes (Signed)
HPI: Sherry Page is a 35 y.o. female who presents to [facility]  today for chief complaint of:  Chief Complaint  Patient presents with  . right ear pain    cough and congestion, low fever    . Location: R side of face and R ear pain . Assoc signs/symptoms: coughing, sinus pain  . Duration: started approx 7 days, worse over  Past few days . Modifying factors: has tried the following OTC/Rx medications: natural remedies, Mucinex, warm compresses, Advil  without relief    Past medical, social and family history reviewed. Current medications and allergies reviewed.     Review of Systems: CONSTITUTIONAL: subjective fever/chills HEAD/EYES/EARS/NOSE/THROAT: yes headache, no vision change or hearing change, yes sore throat CARDIAC: No chest pain/pressure/palpitations, no orthopnea RESPIRATORY: yes cough, no shortness of breath GASTROINTESTINAL: yes nausea, no vomiting, no abdominal pain, no blood in stool, no diarrhea MUSCULOSKELETAL: no myalgia/arthralgia   Exam:  BP 123/74 mmHg  Pulse 79  Temp(Src) 98.2 F (36.8 C) (Oral)  Wt 127 lb (57.607 kg) Constitutional: VSS, see above. General Appearance: alert, well-developed, well-nourished, NAD Eyes: Normal lids and conjunctive, non-icteric sclera Ears, Nose, Mouth, Throat: Normal external inspection ears/nares/mouth/lips/gums, abnormal - R TM normal landmarks clear mild effusion and scant red blood behind the TM, nonbulging, light reflex intact. L TM normal, MMM;       posterior pharynx with erythema, without exudate Neck: No masses, trachea midline. No thyroid enlargement/tenderness/mass appreciated, normal lymph nodes Respiratory: Normal respiratory effort. No  wheeze/rhonchi/rales Cardiovascular: S1/S2 normal, no murmur/rub/gallop auscultated. RRR. No lower extremity edema.    No results found for this or any previous visit (from the past 72 hour(s)). No results found.   ASSESSMENT/PLAN:  Right tympanic membrane as above, doesn't appear to be infected but certainly could be due to pressure from blocked eustachian tube due to viral infection. Medication treatment as below for symptoms and to help clear out sinuses, patient was given antibiotic to take if she starts to feel worse, she she's had on the cusp of the 7-10 days at which a viral illness will usually resolve, advised if she starts to feel worse fill the antibiotic sooner otherwise fill 10 to 14 days after she initially started having symptoms. She also requested albuterol refill since this is helping with her cough last time she used it.  Viral URI with cough - Plan: methylPREDNISolone (MEDROL DOSEPAK) 4 MG TBPK tablet, albuterol (PROVENTIL HFA;VENTOLIN HFA) 108 (90 Base) MCG/ACT inhaler, ipratropium (ATROVENT) 0.03 % nasal spray, benzonatate (TESSALON) 200 MG capsule  Eustachian tube disorder, right - Plan: ipratropium (ATROVENT) 0.03 % nasal spray  Ear pain, referred, right  Ear pain, right - Plan: amoxicillin (AMOXIL) 500 MG tablet    Return if symptoms worsen or fail to improve.

## 2016-02-29 IMAGING — US US OB FOLLOW-UP
1 series · 12 of 28 positions shown · non-contrast
Comparison: none

[Series 1: us ob follow up · 12 of 68 slices shown]
[im 3/68]
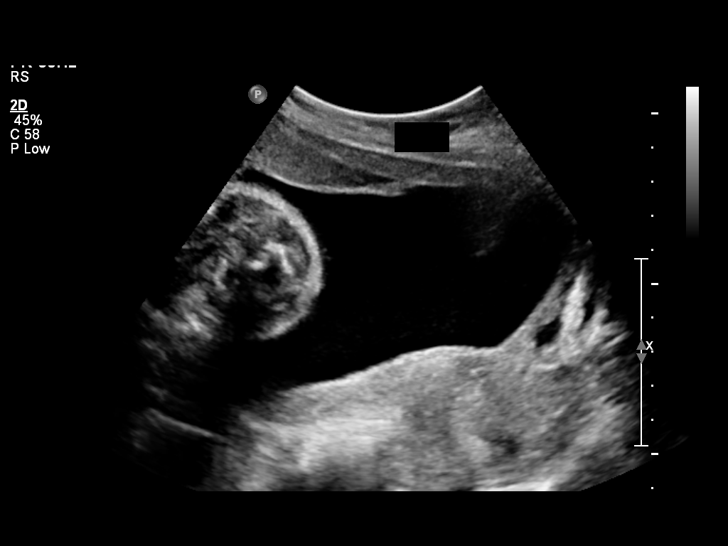
[im 8/68]
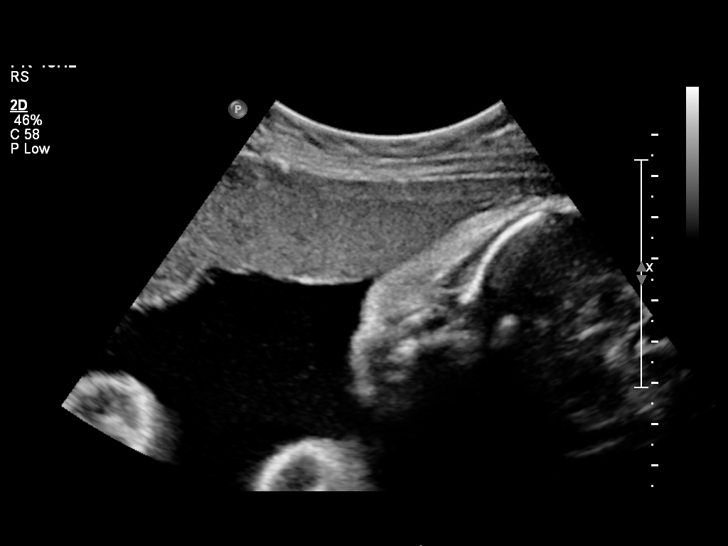
[im 13/68]
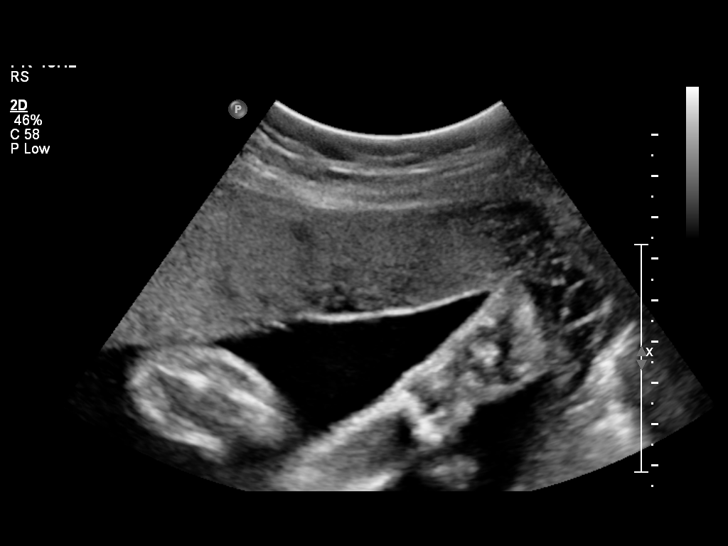
[im 20/68]
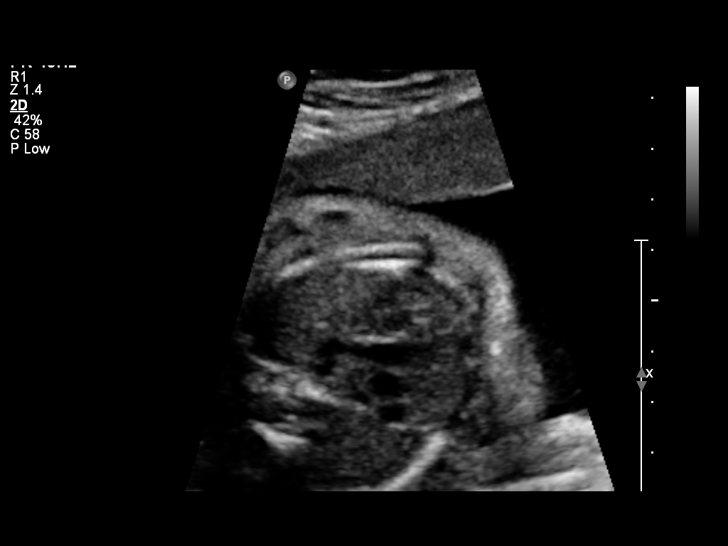
[im 25/68]
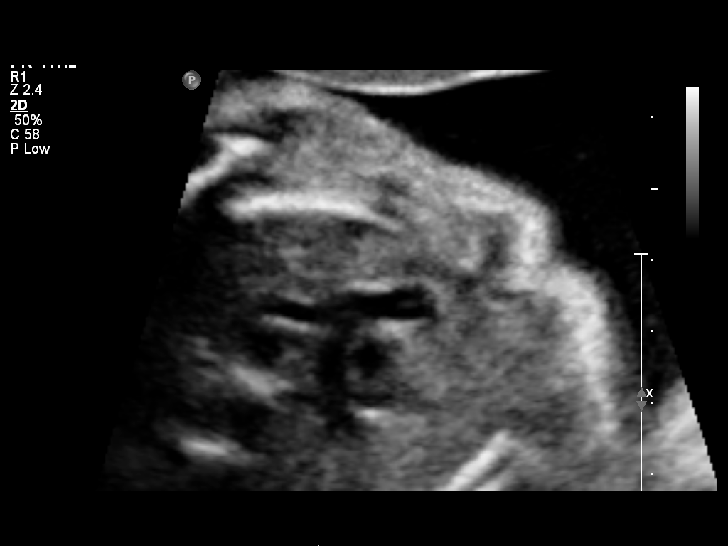
[im 30/68]
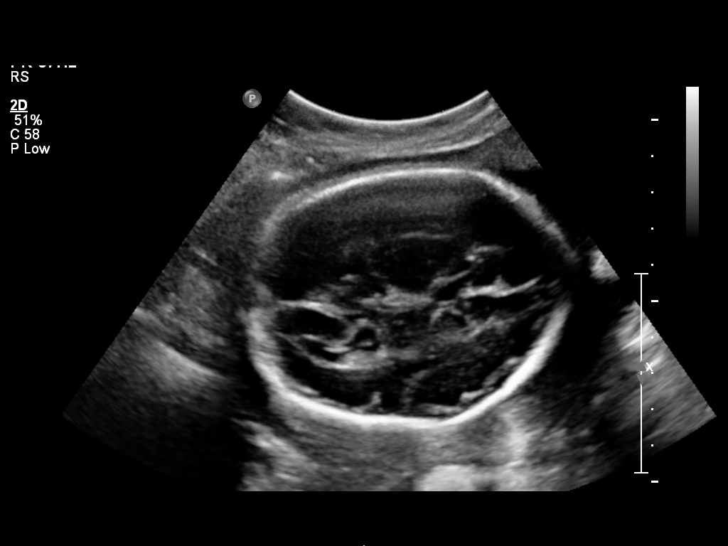
[im 38/68]
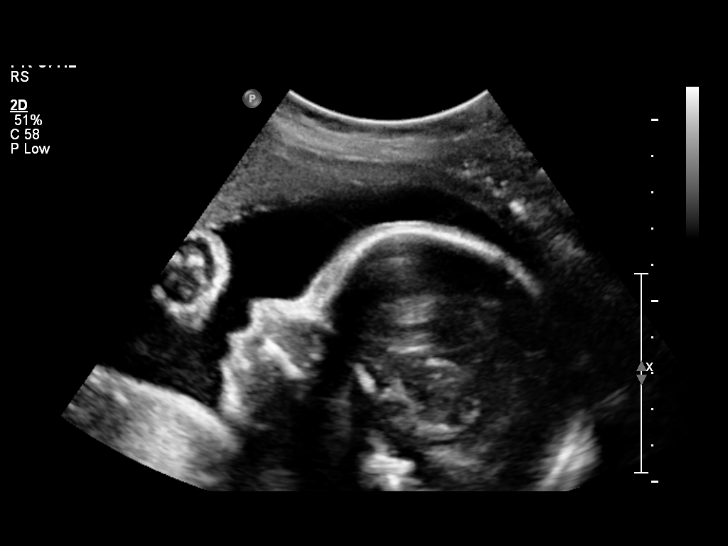
[im 43/68]
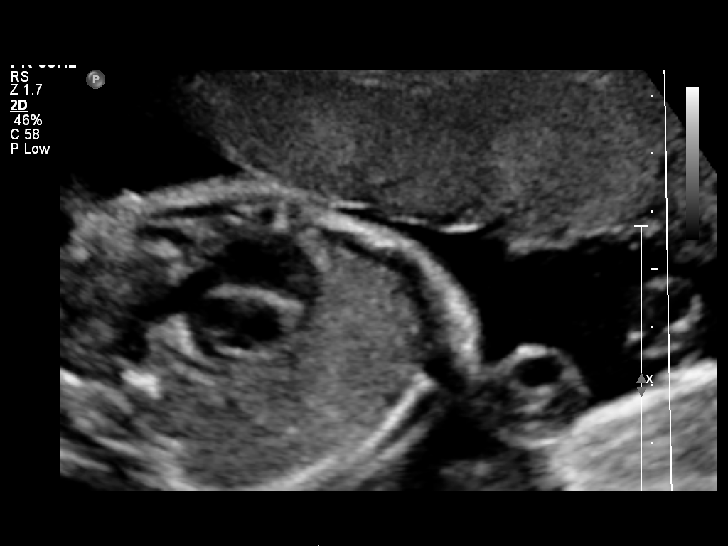
[im 48/68]
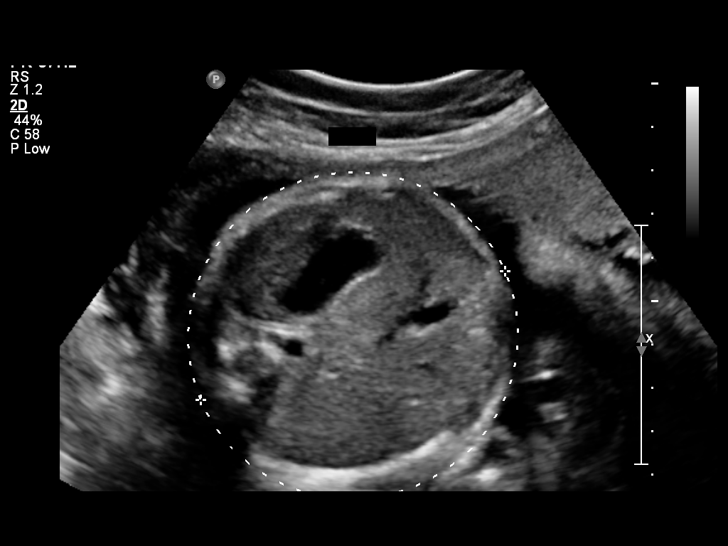
[im 55/68]
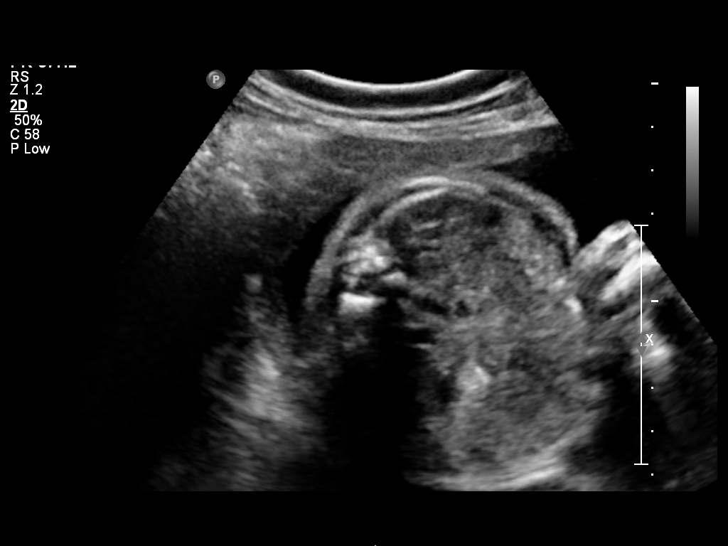
[im 60/68]
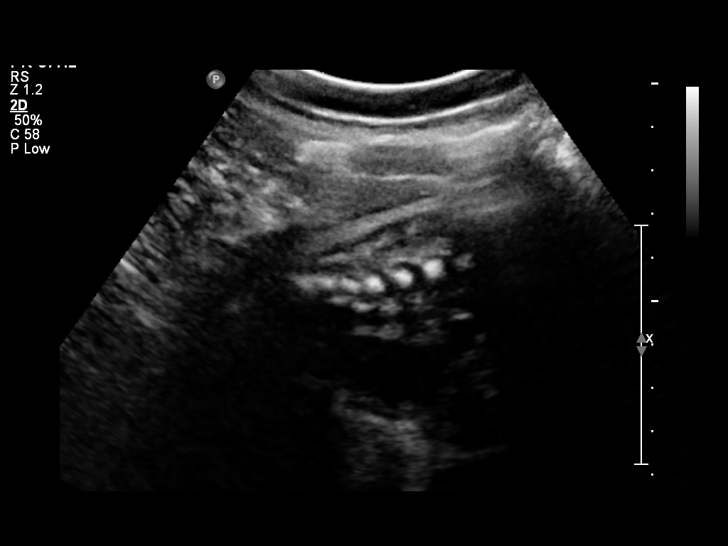
[im 65/68]
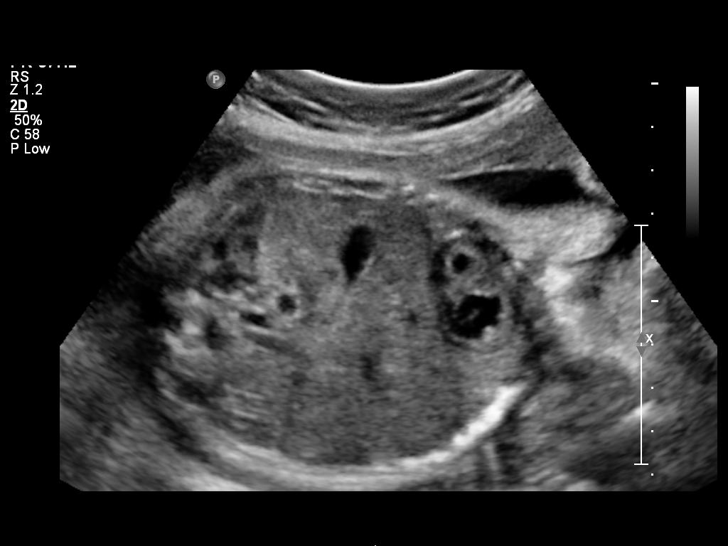

[12 of 28 positions shown; findings below may reference images not displayed]

OBSTETRICS REPORT
                      (Signed Final 06/15/2014 [DATE])

Service(s) Provided

 US OB FOLLOW UP                                       76816.1
Indications

 Follow-up incomplete fetal anatomic evaluation
 Low lying placenta: No bleeding
 Poor obstetric history: Previous gestational
 diabetes
Fetal Evaluation

 Num Of Fetuses:    1
 Fetal Heart Rate:  138                          bpm
 Cardiac Activity:  Observed
 Presentation:      Cephalic
 Placenta:          Anterior, above cervical os
 P. Cord            Visualized, central
 Insertion:

 Amniotic Fluid
 AFI FV:      Subjectively within normal limits
 AFI Sum:     18.29   cm       70  %Tile     Larg Pckt:    5.38  cm
 RUQ:   4.2     cm   RLQ:    4.08   cm    LUQ:   5.38    cm   LLQ:    4.63   cm
Biometry

 BPD:     67.6  mm     G. Age:  27w 2d                CI:        64.85   70 - 86
                                                      FL/HC:      19.7   18.8 -

 HC:     269.8  mm     G. Age:  29w 3d       64  %    HC/AC:      1.15   1.05 -

 AC:     233.9  mm     G. Age:  27w 5d       34  %    FL/BPD:     78.6   71 - 87
 FL:      53.1  mm     G. Age:  28w 2d       41  %    FL/AC:      22.7   20 - 24
 HUM:     45.6  mm     G. Age:  27w 0d       23  %

 Est. FW:    9979  gm      2 lb 9 oz     51  %
Gestational Age

 U/S Today:     28w 1d                                        EDD:   09/06/14
 Best:          28w 0d     Det. By:  Early Ultrasound         EDD:   09/07/14
Anatomy
 Cranium:          Appears normal         Aortic Arch:      Not well visualized
 Fetal Cavum:      Appears normal         Ductal Arch:      Not well visualized
 Ventricles:       Appears normal         Diaphragm:        Appears normal
 Choroid Plexus:   Appears normal         Stomach:          Appears normal, left
                                                            sided
 Cerebellum:       Appears normal         Abdomen:          Appears normal
 Posterior Fossa:  Appears normal         Abdominal Wall:   Previously seen
 Nuchal Fold:      Previously seen        Cord Vessels:     Previously seen
 Face:             Appears normal         Kidneys:          Appear normal
                   (orbits and profile)
 Lips:             Appears normal         Bladder:          Appears normal
 Heart:            Appears normal         Spine:            Appears normal
                   (4CH, axis, and
                   situs)
 RVOT:             Appears normal         Lower             Previously seen
                                          Extremities:
 LVOT:             Appears normal         Upper             Previously seen
                                          Extremities:

 Other:  Fetus appears to be a female. Nasal bone visualized. Heels and 5th
         digit previously visualized. Technically difficult due to fetal position.
Cervix Uterus Adnexa

 Cervical Length:    3.69     cm

 Cervix:       Normal appearance by transabdominal scan.
 Uterus:       No abnormality visualized.
 Cul De Sac:   No free fluid seen.

 Left Ovary:    Not visualized.
 Right Ovary:   Within normal limits.

 Adnexa:     No abnormality visualized.
Comments

 The patient's fetal anatomic survey is not complete due to
 maternal body habitus and fetal position.  However, no gross
 fetal anomalies were identified.  Ms. Saullo has already had
 2 ultrasounds to evaluate fetal anatomy.  It is unlikely that
 further ultrasounds will allow completion of the fetal anatomic
 survey.  However, if additional attempts are desired.  Please
 schedule and additional ultraosund in 4 weeks in the [facility].
Impression

 Single living intrauterine pregnancy at 28 weeks 0 days.
 Appropriate fetal growth (451%).
 Normal amniotic fluid volume.
 No gross fetal anomalies identified.
Recommendations

 Follow-up ultrasounds as clinically indicated.

 questions or concerns.
                Breuer, Margo

## 2016-05-01 ENCOUNTER — Other Ambulatory Visit (INDEPENDENT_AMBULATORY_CARE_PROVIDER_SITE_OTHER): Payer: BLUE CROSS/BLUE SHIELD

## 2016-05-01 DIAGNOSIS — O26851 Spotting complicating pregnancy, first trimester: Secondary | ICD-10-CM

## 2016-05-01 NOTE — Progress Notes (Signed)
Pt here for UPT because she has a positive one on Sunday at home.  Test today showed neg and patient is now bleeding bright red.  Bedside U/S shows thickened endometrium but no corpus luteum noted.  No free fluid seen.  Pt declines BHCG.  She is still breast feeing her 35 year old.
